# Patient Record
Sex: Female | Born: 1985 | Race: Black or African American | Hispanic: No | Marital: Married | Smoking: Never smoker
Health system: Southern US, Community
[De-identification: ages and names within clinical notes are randomized; demographics above are authoritative.]

## PROBLEM LIST (undated history)

## (undated) DIAGNOSIS — K759 Inflammatory liver disease, unspecified: Secondary | ICD-10-CM

## (undated) DIAGNOSIS — E079 Disorder of thyroid, unspecified: Secondary | ICD-10-CM

## (undated) HISTORY — DX: Inflammatory liver disease, unspecified: K75.9

## (undated) HISTORY — DX: Disorder of thyroid, unspecified: E07.9

---
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
## 2012-08-02 NOTE — L&D Delivery Note (Signed)
Delivery Note Pt arrived C/C/+2 with an urge to push.  AROM with light meconium.  At 8:18 AM a viable female was delivered via Vaginal, Spontaneous Delivery (Presentation: ROA  ).Loose nuchal cord, reduced.  APGAR: 8, 9; weight pending.   Placenta status: Intact, Spontaneous.  Cord: 3 vessels with the following complications: None.   Anesthesia: Local  Episiotomy: None Lacerations: 1st degree;Periurethral Suture Repair: 3.0 vicryl Est. Blood Loss (mL): 200  Mom to postpartum.  Baby to nursery-stable.  CRESENZO-DISHMAN,Gilbert Narain 04/27/2013, 9:14 AM

## 2012-08-02 NOTE — L&D Delivery Note (Signed)
Attestation of Attending Supervision of Advanced Practitioner (CNM/NP): Evaluation and management procedures were performed by the Advanced Practitioner under my supervision and collaboration.  I have reviewed the Advanced Practitioner's note and chart, and I agree with the management and plan.  HARRAWAY-SMITH, Cathe Bilger 11:51 AM

## 2012-10-25 ENCOUNTER — Encounter: Payer: Self-pay | Admitting: *Deleted

## 2012-11-09 ENCOUNTER — Encounter: Payer: Self-pay | Admitting: *Deleted

## 2012-11-09 ENCOUNTER — Other Ambulatory Visit: Payer: Self-pay | Admitting: Obstetrics & Gynecology

## 2012-11-09 ENCOUNTER — Other Ambulatory Visit (HOSPITAL_COMMUNITY)
Admission: RE | Admit: 2012-11-09 | Discharge: 2012-11-09 | Disposition: A | Discharge status: Home / Self Care | Payer: Medicaid Other | Source: Ambulatory Visit | Attending: Obstetrics & Gynecology | Admitting: Obstetrics & Gynecology

## 2012-11-09 ENCOUNTER — Ambulatory Visit (INDEPENDENT_AMBULATORY_CARE_PROVIDER_SITE_OTHER): Payer: Medicaid Other | Admitting: Obstetrics & Gynecology

## 2012-11-09 ENCOUNTER — Encounter: Payer: Self-pay | Admitting: Obstetrics & Gynecology

## 2012-11-09 VITALS — BP 105/70 | Temp 97.7°F | Wt 101.7 lb

## 2012-11-09 DIAGNOSIS — O0992 Supervision of high risk pregnancy, unspecified, second trimester: Secondary | ICD-10-CM | POA: Insufficient documentation

## 2012-11-09 DIAGNOSIS — Z01419 Encounter for gynecological examination (general) (routine) without abnormal findings: Secondary | ICD-10-CM | POA: Insufficient documentation

## 2012-11-09 DIAGNOSIS — O0991 Supervision of high risk pregnancy, unspecified, first trimester: Secondary | ICD-10-CM

## 2012-11-09 DIAGNOSIS — O09299 Supervision of pregnancy with other poor reproductive or obstetric history, unspecified trimester: Secondary | ICD-10-CM | POA: Insufficient documentation

## 2012-11-09 DIAGNOSIS — O09292 Supervision of pregnancy with other poor reproductive or obstetric history, second trimester: Secondary | ICD-10-CM

## 2012-11-09 DIAGNOSIS — Z113 Encounter for screening for infections with a predominantly sexual mode of transmission: Secondary | ICD-10-CM | POA: Insufficient documentation

## 2012-11-09 DIAGNOSIS — O98519 Other viral diseases complicating pregnancy, unspecified trimester: Secondary | ICD-10-CM

## 2012-11-09 NOTE — Progress Notes (Signed)
#  [id number] interpreter  Subjective:    Kelly Luna is a G2P1001 105w2d being seen today for her first obstetrical visit.  Her obstetrical history is significant for IUGR (5 pounds 7 ounces). Pregnancy history fully reviewed.  Pt has hepatitis B per medical record but pt denies today.  Labs pending.    Patient reports no complaints.  Filed Vitals:   11/09/12 0827  BP: 105/70  Temp: 97.7 F (36.5 C)  Weight: 101 lb 11.2 oz (46.131 kg)    HISTORY: OB History   Grav Para Term Preterm Abortions TAB SAB Ect Mult Living   2 1 1       1      # Outc Date GA Lbr Len/2nd Wgt Sex Del Anes PTL Lv   1 TRM 12/11 [redacted]w[redacted]d 01:30 5lb7oz(2.466kg) F SVD None  Yes   2 CUR              Past Medical History  Diagnosis Date  . Thyroid disease 2001    taking no medication  . Hepatitis     Hep B   History reviewed. No pertinent past surgical history. History reviewed. No pertinent family history.   Exam    Uterus:     Pelvic Exam:    Perineum: About 16 weeks   Vulva: normal   Vagina:  normal mucosa   pH: n/a   Cervix: closed, evidence of old cervical tear at 7 o'clock   Adnexa: normal adnexa   Bony Pelvis: average  System: Breast:  normal appearance, no masses or tenderness   Skin: normal coloration and turgor, no rashes    Neurologic: normal mood   Extremities: gait was normal for age, joint mobility appears intact   HEENT sclera clear, anicteric and oropharynx clear, no lesions   Mouth/Teeth mucous membranes moist, pharynx normal without lesions   Neck supple and no masses   Cardiovascular: regular rate and rhythm   Respiratory:  chest clear, no wheezing, crepitations, rhonchi, normal symmetric air entry   Abdomen: soft, non-tender; bowel sounds normal; no masses,  no organomegaly   Urinary: urethral meatus normal      Assessment:    Pregnancy: G2P1001 Patient Active Problem List  Diagnosis  . IUGR (intrauterine growth restriction) in prior pregnancy, pregnant         Plan:     Initial labs drawn. Prenatal vitamins. Problem list reviewed and updated. Genetic Screening discussed Quad Screen: ordered.  Ultrasound discussed; fetal survey: ordered.  Follow up in 4 weeks.    Evolette Pendell H. 11/09/2012

## 2012-11-09 NOTE — Progress Notes (Signed)
Ultrasound scheduled for 12/07/12 at 1:00 pm for anatomy.  [employer] # [phone] interpreted information.

## 2012-11-10 LAB — OBSTETRIC PANEL
Basophils Relative: 0 % (ref 0–1)
Eosinophils Absolute: 0.1 10*3/uL (ref 0.0–0.7)
Hemoglobin: 12 g/dL (ref 12.0–15.0)
Hepatitis B Surface Ag: POSITIVE — AB
Lymphs Abs: 1.1 10*3/uL (ref 0.7–4.0)
MCH: 28.9 pg (ref 26.0–34.0)
MCHC: 33.1 g/dL (ref 30.0–36.0)
Monocytes Relative: 10 % (ref 3–12)
Neutro Abs: 4.2 10*3/uL (ref 1.7–7.7)
Neutrophils Relative %: 70 % (ref 43–77)
Platelets: 233 10*3/uL (ref 150–400)
RBC: 4.15 MIL/uL (ref 3.87–5.11)

## 2012-11-11 ENCOUNTER — Other Ambulatory Visit: Payer: Self-pay | Admitting: Obstetrics & Gynecology

## 2012-11-11 ENCOUNTER — Encounter: Payer: Self-pay | Admitting: Obstetrics & Gynecology

## 2012-11-11 DIAGNOSIS — O98419 Viral hepatitis complicating pregnancy, unspecified trimester: Secondary | ICD-10-CM

## 2012-11-11 DIAGNOSIS — B1910 Unspecified viral hepatitis B without hepatic coma: Secondary | ICD-10-CM | POA: Insufficient documentation

## 2012-11-13 ENCOUNTER — Telehealth: Payer: Self-pay

## 2012-11-13 LAB — HEMOGLOBINOPATHY EVALUATION
Hemoglobin Other: 0 %
Hgb A2 Quant: 2.6 % (ref 2.2–3.2)
Hgb A: 97.4 % (ref 96.8–97.8)
Hgb S Quant: 0 %

## 2012-11-13 NOTE — Telephone Encounter (Signed)
Message copied by Faythe Casa on Mon Nov 13, 2012  4:09 PM ------      Message from: Lesly Dukes      Created: Sat Nov 11, 2012  9:20 AM       Hepatitis B positive during pregnancy: I will check Hep B Sag, (hep panel), Hepatitis B DNA, hep B e Ag, anti Hep B E ag, PT/INR CMP       ------

## 2012-11-13 NOTE — Telephone Encounter (Signed)
Called pt with [facility] [employer] # [phone] and was unable to leave message due to voicemail box not set up yet.

## 2012-11-16 NOTE — Telephone Encounter (Signed)
Added Note for patient to have labs drawn at next visit.

## 2012-11-23 ENCOUNTER — Other Ambulatory Visit: Payer: Self-pay | Admitting: Family Medicine

## 2012-11-23 ENCOUNTER — Ambulatory Visit (INDEPENDENT_AMBULATORY_CARE_PROVIDER_SITE_OTHER): Payer: Medicaid Other | Admitting: Family Medicine

## 2012-11-23 VITALS — BP 103/67 | Temp 97.9°F | Wt 103.5 lb

## 2012-11-23 DIAGNOSIS — O0992 Supervision of high risk pregnancy, unspecified, second trimester: Secondary | ICD-10-CM

## 2012-11-23 DIAGNOSIS — O98519 Other viral diseases complicating pregnancy, unspecified trimester: Secondary | ICD-10-CM

## 2012-11-23 DIAGNOSIS — O09292 Supervision of pregnancy with other poor reproductive or obstetric history, second trimester: Secondary | ICD-10-CM

## 2012-11-23 DIAGNOSIS — O09299 Supervision of pregnancy with other poor reproductive or obstetric history, unspecified trimester: Secondary | ICD-10-CM

## 2012-11-23 LAB — COMPREHENSIVE METABOLIC PANEL
BUN: 5 mg/dL — ABNORMAL LOW (ref 6–23)
CO2: 27 mEq/L (ref 19–32)
Calcium: 8.9 mg/dL (ref 8.4–10.5)
Chloride: 103 mEq/L (ref 96–112)
Creat: 0.45 mg/dL — ABNORMAL LOW (ref 0.50–1.10)
Total Bilirubin: 0.3 mg/dL (ref 0.3–1.2)

## 2012-11-23 LAB — PROTIME-INR
INR: 1.03 (ref <1.50)
Prothrombin Time: 13.5 seconds (ref 11.6–15.2)

## 2012-11-23 LAB — POCT URINALYSIS DIP (DEVICE)
Leukocytes, UA: NEGATIVE
Nitrite: NEGATIVE
Protein, ur: 30 mg/dL — AB
Urobilinogen, UA: 0.2 mg/dL (ref 0.0–1.0)

## 2012-11-23 NOTE — Progress Notes (Signed)
Reviewed labs with pt. Needs quad from HD. Hep B labs today Anatomy scheduled.

## 2012-11-23 NOTE — Progress Notes (Signed)
Pulse 70 

## 2012-11-23 NOTE — Patient Instructions (Addendum)
-       ( 3-6  )        .        9    1 1/2 .       18  20   .     .    . A   (  )    .       (  ) .           .             .              .       ( cholasma    )           .      Marland Kitchen                     .                .          .       (  ) .   (    )      .          .        24  28   .        .       .                .         .          Marland Kitchen                 .              .            Marland Kitchen                Marland Kitchen                       .              .       .              (  ) .                  .                 .                    .                 .     Marland Kitchen               .               .           .         .      .                .            (   ) .               .             .         .           .         (  )   .  15    3  4                .           .            .      4-5    .               .           .     .                   .                .  (     )         .                 .         .                 .                   (    ) .       .           .   ɡ     .                .               .            .            .      .                  .         .             .                 .      .                  .          .         .                    .        .                 .        (   )     ɡ               .         .                  .           2 .     :    .       .        .             .        .             .              .        .        .                 .            .                        .        .     .     .        .         .           .        .              .         10       3     .               .           .               .                  .    Marland Kitchen                   .             .      1    .       .                    .                     .                      .   .            .         .      .           .          .         .   .           .         .    :       .                   .   MEDICAL CARE IF :         102   ( 38.9   )       .       (   ) .                     .            .         .          .               .        (  )      .         .    2        2      1          .         .         .         .     (  ) .       ( )         .         .         .             .            .            .       .  : 07/13/2001  : 2011/10/11    : 01/15/2009 [employer]     [employer] .                  .                 .       (  )       .            .              (  SIDS ) .                    .           .             Marland Kitchen               Marland Kitchen                 .                 .        .    A                      3 .     .         .            .              .      3 4    .                       Marland Kitchen  [employer] AT THE              .    4      .           .        .                       Marland Kitchen                .                 .      2 3           .    "  . "                .                .                     .       . HOW TO TELL           .                   .           :     .       .       8 12     24   .       unlatches      (10 20    )      .   5 6   ( 6 8    )   24   5    6  .        3 4   24    6   .      .     4 7        4    .     .              .                 .      :  2 3 .                 .        10 20    .     .        .           Marland Kitchen            5 10      .          .       .              Marland Kitchen                Marland Kitchen               Marland Kitchen                  .                .          .          Marland Kitchen                     Marland Kitchen             Rolm Gala .          .                  .             24 48 .                .        .      .                   .       2 5   .                  .  "  "   48  24 .              5 10 .                    .             3 4     .      .        .          .                 .           .         ( 8  ).     .         .    :        .              .               .     6      5   .                  .   .  : 07/19/2005  : 2012/01/18    : 2011/10/17

## 2012-11-24 LAB — HEPATITIS B DNA, ULTRAQUANTITATIVE, PCR
Hepatitis B DNA (Calc): <116 copies/mL (ref <116)
Hepatitis B DNA: <20 IU/mL (ref <20)

## 2012-11-27 ENCOUNTER — Encounter: Payer: Self-pay | Admitting: Obstetrics & Gynecology

## 2012-11-27 ENCOUNTER — Encounter: Payer: Self-pay | Admitting: Family Medicine

## 2012-12-07 ENCOUNTER — Ambulatory Visit (HOSPITAL_COMMUNITY)
Admission: RE | Admit: 2012-12-07 | Discharge: 2012-12-07 | Disposition: A | Discharge status: Home / Self Care | Payer: Medicaid Other | Source: Ambulatory Visit | Attending: Obstetrics & Gynecology | Admitting: Obstetrics & Gynecology

## 2012-12-07 DIAGNOSIS — O0991 Supervision of high risk pregnancy, unspecified, first trimester: Secondary | ICD-10-CM

## 2012-12-07 DIAGNOSIS — Z3689 Encounter for other specified antenatal screening: Secondary | ICD-10-CM | POA: Insufficient documentation

## 2012-12-08 ENCOUNTER — Encounter: Payer: Self-pay | Admitting: Obstetrics & Gynecology

## 2012-12-21 ENCOUNTER — Ambulatory Visit (INDEPENDENT_AMBULATORY_CARE_PROVIDER_SITE_OTHER): Payer: Medicaid Other | Admitting: Family Medicine

## 2012-12-21 VITALS — BP 112/73 | Temp 97.2°F | Ht 63.0 in | Wt 109.7 lb

## 2012-12-21 DIAGNOSIS — O98519 Other viral diseases complicating pregnancy, unspecified trimester: Secondary | ICD-10-CM

## 2012-12-21 DIAGNOSIS — O0992 Supervision of high risk pregnancy, unspecified, second trimester: Secondary | ICD-10-CM

## 2012-12-21 LAB — POCT URINALYSIS DIP (DEVICE)
Glucose, UA: NEGATIVE mg/dL
Hgb urine dipstick: NEGATIVE
Protein, ur: NEGATIVE mg/dL
Specific Gravity, Urine: 1.02 (ref 1.005–1.030)
Urobilinogen, UA: 0.2 mg/dL (ref 0.0–1.0)

## 2012-12-21 MED ORDER — PRENATAL VITAMINS PLUS 27-1 MG PO TABS: 1.0000 | ORAL_TABLET | Freq: Every day | ORAL | Status: DC

## 2012-12-21 NOTE — Progress Notes (Signed)
No concerns. 

## 2012-12-21 NOTE — Patient Instructions (Signed)
Hepatitis B in Pregnancy Hepatitis B is a DNA virus that can be found in the blood, semen, vaginal fluids and saliva. HBV can cause liver damage (cirrhosis), liver cancer and even death. It is spread by:  Touching an object that has the virus on it (the virus can live for 7 days on surfaces).  Infected blood.  Body fluids.  Sexual intercourse.  Infected needles.  Childbirth. People infected with HBV can spread the disease to others even if they are not sick.  All pregnant women should be tested for HBV even if they do not have symptoms. It is safe to give the HBV vaccine to a pregnant woman. They can pass the virus on to the baby at delivery. Women infected with HBV can also cause medical problems for herself. Pregnant women infected with HBV in the first trimester have a higher risk of losing their baby (miscarriage).  Long-term (chronic) infection of the HBV is seen more often in babies and children. Infected babies have a high chance of being carriers of HBV and can pass the virus on to others. These babies also are at risk of dying from cirrhosis of the liver when they are adults. CAUSES  People infected with HBV can infect others by contact with or through:  Poor hygiene.  Infected blood.  Infected needles.  Body fluids.  Sexual intercourse.  Childbirth. SYMPTOMS   Weakness.  Tiredness.  Feeling sick to your stomach (nausea).  Loss of hunger (appetite).  Pain in the liver area (upper abdomen and stomach area).  Muscles pains.  Dark urine (brownish).  Wallace Cullens or very light color stool.  Eyes and skin become yellow (jaundice).  Symptoms of severe hepatitis may cause blood clotting problems and damage to brain cells (encephalopathy). DIAGNOSIS  HBV is diagnosed when a blood test for the virus is positive. If the test is positive, your caregiver will do more blood tests to see if your liver is infected with the virus. Family members and people you have been in  contact with should also be tested for the HBV.  TREATMENT AND PREVENTION  All pregnant women who are HBV negative should get the HBV vaccine.  Anyone exposed to the HBV should get a Hepatitis B Immune Globulin (HBIG) and the HBV vaccine.  Pregnant women who have HBV should get the HBIG vaccine in the third trimester. This will protect the baby at birth.  Pregnant women who have HBV should have their newborn get the HBG vaccine within 12 hours of birth and the first dose of the HBV vaccine.  Pregnant women who do not have HBV should have their newborn get the first dose of the HBV vaccine before leaving the hospital. This will prevent the baby from getting HBV.  All newborns, children and teenagers who have not received the HBV vaccine should get it.  Wash your hands with hot water and soap after a using the toilet, changing the baby and before cooking and feeding the baby. The HBV vaccine is made from only a part of the hepatitis B virus. Therefore, it does not cause an infection. It is usually given in 3 or 4 separate injections. Soreness at the site of injection and a mild fever (99.9 F [37.7 C] or a little bit higher) may happen. Serious problems are very rare. PEOPLE AT RISK FOR HBV WHO SHOULD RECEIVE THE HBV VACCINATION  Health care workers, especially those exposed to blood.  People using and injecting illegal drugs.  People having unprotected  sexual intercourse or have many partners.  Sex partners with one of the partners with the HBV.  Nursing home workers (or home for the disabled).  People with chronic liver or kidney disease (dialysis patients).  People who travel to foreign countries where there are high rates of HBV.  Prisoners.  Homosexuals. PEOPLE WHO SHOULD NOT RECEIVE THE HBV VACCINE:  Anyone allergic to baker's yeast or other ingredients in the vaccine.  If you had an allergic reaction to a past HBV vaccine dose.  If you have any illness at the time of  the vaccine injection. HOME CARE INSTRUCTIONS   If you have never been tested for HBV or received the HBV vaccine, you should do it as soon as possible.  Tell your caregiver if you have any allergies before getting the HBV vaccine.  Do not have unprotected sexual intercourse.  Do not take and inject illegal drugs.  Only take over-the-counter pain medication for pain, discomfort or fever as directed by your caregiver. SEEK IMMEDIATE MEDICAL CARE IF:   You think you have been exposed to the HBV.  You think you are having an allergic reaction to the vaccine.  You develop a fever of 102 F (38.9 C) or higher.  You skin and eyes start looking yellow (jaundiced).  You have dark brown urine.  You have gray or very light colored stool.  You have upper abdominal pain (liver and stomach area).  You feel sick to your stomach (nausea), weak and do not feel like eating. Any severe allergic reaction to the vaccine should be reported by your caregiver or by you to the Vaccine Adverse Event Reporting System (VAERS) by calling [phone] or on the web site, www.vaers.LAg[facility].no. Document Released: 01/05/2008 Document Revised: 04/12/2012 Document Reviewed: 01/05/2008 [facility] Patient Information 2014 [place], [place].  Pregnancy - Second Trimester The second trimester of pregnancy (3 to 6 months) is a period of rapid growth for you and your baby. At the end of the sixth month, your baby is about 9 inches long and weighs 1 1/2 pounds. You will begin to feel the baby move between 18 and 20 weeks of the pregnancy. This is called quickening. Weight gain is faster. A clear fluid (colostrum) may leak out of your breasts. You may feel small contractions of the womb (uterus). This is known as false labor or Braxton-Hicks contractions. This is like a practice for labor when the baby is ready to be born. Usually, the problems with morning sickness have usually passed by the end of your first trimester. Some  women develop small dark blotches (called cholasma, mask of pregnancy) on their face that usually goes away after the baby is born. Exposure to the sun makes the blotches worse. Acne may also develop in some pregnant women and pregnant women who have acne, may find that it goes away. PRENATAL EXAMS  Blood work may continue to be done during prenatal exams. These tests are done to check on your health and the probable health of your baby. Blood work is used to follow your blood levels (hemoglobin). Anemia (low hemoglobin) is common during pregnancy. Iron and vitamins are given to help prevent this. You will also be checked for diabetes between 24 and 28 weeks of the pregnancy. Some of the previous blood tests may be repeated.  The size of the uterus is measured during each visit. This is to make sure that the baby is continuing to grow properly according to the dates of the pregnancy.  Your  blood pressure is checked every prenatal visit. This is to make sure you are not getting toxemia.  Your urine is checked to make sure you do not have an infection, diabetes or protein in the urine.  Your weight is checked often to make sure gains are happening at the suggested rate. This is to ensure that both you and your baby are growing normally.  Sometimes, an ultrasound is performed to confirm the proper growth and development of the baby. This is a test which bounces harmless sound waves off the baby so your caregiver can more accurately determine due dates. Sometimes, a test is done on the amniotic fluid surrounding the baby. This test is called an amniocentesis. The amniotic fluid is obtained by sticking a needle into the belly (abdomen). This is done to check the chromosomes in instances where there is a concern about possible genetic problems with the baby. It is also sometimes done near the end of pregnancy if an early delivery is required. In this case, it is done to help make sure the baby's lungs are  mature enough for the baby to live outside of the womb. CHANGES OCCURING IN THE SECOND TRIMESTER OF PREGNANCY Your body goes through many changes during pregnancy. They vary from person to person. Talk to your caregiver about changes you notice that you are concerned about.  During the second trimester, you will likely have an increase in your appetite. It is normal to have cravings for certain foods. This varies from person to person and pregnancy to pregnancy.  Your lower abdomen will begin to bulge.  You may have to urinate more often because the uterus and baby are pressing on your bladder. It is also common to get more bladder infections during pregnancy. You can help this by drinking lots of fluids and emptying your bladder before and after intercourse.  You may begin to get stretch marks on your hips, abdomen, and breasts. These are normal changes in the body during pregnancy. There are no exercises or medicines to take that prevent this change.  You may begin to develop swollen and bulging veins (varicose veins) in your legs. Wearing support hose, elevating your feet for 15 minutes, 3 to 4 times a day and limiting salt in your diet helps lessen the problem.  Heartburn may develop as the uterus grows and pushes up against the stomach. Antacids recommended by your caregiver helps with this problem. Also, eating smaller meals 4 to 5 times a day helps.  Constipation can be treated with a stool softener or adding bulk to your diet. Drinking lots of fluids, and eating vegetables, fruits, and whole grains are helpful.  Exercising is also helpful. If you have been very active up until your pregnancy, most of these activities can be continued during your pregnancy. If you have been less active, it is helpful to start an exercise program such as walking.  Hemorrhoids may develop at the end of the second trimester. Warm sitz baths and hemorrhoid cream recommended by your caregiver helps hemorrhoid  problems.  Backaches may develop during this time of your pregnancy. Avoid heavy lifting, wear low heal shoes, and practice good posture to help with backache problems.  Some pregnant women develop tingling and numbness of their hand and fingers because of swelling and tightening of ligaments in the wrist (carpel tunnel syndrome). This goes away after the baby is born.  As your breasts enlarge, you may have to get a bigger bra. Get a comfortable, cotton,  support bra. Do not get a nursing bra until the last month of the pregnancy if you will be nursing the baby.  You may get a dark line from your belly button to the pubic area called the linea nigra.  You may develop rosy cheeks because of increase blood flow to the face.  You may develop spider looking lines of the face, neck, arms, and chest. These go away after the baby is born. HOME CARE INSTRUCTIONS   It is extremely important to avoid all smoking, herbs, alcohol, and unprescribed drugs during your pregnancy. These chemicals affect the formation and growth of the baby. Avoid these chemicals throughout the pregnancy to ensure the delivery of a healthy infant.  Most of your home care instructions are the same as suggested for the first trimester of your pregnancy. Keep your caregiver's appointments. Follow your caregiver's instructions regarding medicine use, exercise, and diet.  During pregnancy, you are providing food for you and your baby. Continue to eat regular, well-balanced meals. Choose foods such as meat, fish, milk and other low fat dairy products, vegetables, fruits, and whole-grain breads and cereals. Your caregiver will tell you of the ideal weight gain.  A physical sexual relationship may be continued up until near the end of pregnancy if there are no other problems. Problems could include early (premature) leaking of amniotic fluid from the membranes, vaginal bleeding, abdominal pain, or other medical or pregnancy  problems.  Exercise regularly if there are no restrictions. Check with your caregiver if you are unsure of the safety of some of your exercises. The greatest weight gain will occur in the last 2 trimesters of pregnancy. Exercise will help you:  Control your weight.  Get you in shape for labor and delivery.  Lose weight after you have the baby.  Wear a good support or jogging bra for breast tenderness during pregnancy. This may help if worn during sleep. Pads or tissues may be used in the bra if you are leaking colostrum.  Do not use hot tubs, steam rooms or saunas throughout the pregnancy.  Wear your seat belt at all times when driving. This protects you and your baby if you are in an accident.  Avoid raw meat, uncooked cheese, cat litter boxes, and soil used by cats. These carry germs that can cause birth defects in the baby.  The second trimester is also a good time to visit your dentist for your dental health if this has not been done yet. Getting your teeth cleaned is okay. Use a soft toothbrush. Brush gently during pregnancy.  It is easier to leak urine during pregnancy. Tightening up and strengthening the pelvic muscles will help with this problem. Practice stopping your urination while you are going to the bathroom. These are the same muscles you need to strengthen. It is also the muscles you would use as if you were trying to stop from passing gas. You can practice tightening these muscles up 10 times a set and repeating this about 3 times per day. Once you know what muscles to tighten up, do not perform these exercises during urination. It is more likely to contribute to an infection by backing up the urine.  Ask for help if you have financial, counseling, or nutritional needs during pregnancy. Your caregiver will be able to offer counseling for these needs as well as refer you for other special needs.  Your skin may become oily. If so, wash your face with mild soap, use non-greasy  moisturizer  and oil or cream based makeup. MEDICINES AND DRUG USE IN PREGNANCY  Take prenatal vitamins as directed. The vitamin should contain 1 milligram of folic acid. Keep all vitamins out of reach of children. Only a couple vitamins or tablets containing iron may be fatal to a baby or young child when ingested.  Avoid use of all medicines, including herbs, over-the-counter medicines, not prescribed or suggested by your caregiver. Only take over-the-counter or prescription medicines for pain, discomfort, or fever as directed by your caregiver. Do not use aspirin.  Let your caregiver also know about herbs you may be using.  Alcohol is related to a number of birth defects. This includes fetal alcohol syndrome. All alcohol, in any form, should be avoided completely. Smoking will cause low birth rate and premature babies.  Street or illegal drugs are very harmful to the baby. They are absolutely forbidden. A baby born to an addicted mother will be addicted at birth. The baby will go through the same withdrawal an adult does. SEEK MEDICAL CARE IF:  You have any concerns or worries during your pregnancy. It is better to call with your questions if you feel they cannot wait, rather than worry about them. SEEK IMMEDIATE MEDICAL CARE IF:   An unexplained oral temperature above 102 F (38.9 C) develops, or as your caregiver suggests.  You have leaking of fluid from the vagina (birth canal). If leaking membranes are suspected, take your temperature and tell your caregiver of this when you call.  There is vaginal spotting, bleeding, or passing clots. Tell your caregiver of the amount and how many pads are used. Light spotting in pregnancy is common, especially following intercourse.  You develop a bad smelling vaginal discharge with a change in the color from clear to white.  You continue to feel sick to your stomach (nauseated) and have no relief from remedies suggested. You vomit blood or coffee  ground-like materials.  You lose more than 2 pounds of weight or gain more than 2 pounds of weight over 1 week, or as suggested by your caregiver.  You notice swelling of your face, hands, feet, or legs.  You get exposed to [place] measles and have never had them.  You are exposed to fifth disease or chickenpox.  You develop belly (abdominal) pain. Round ligament discomfort is a common non-cancerous (benign) cause of abdominal pain in pregnancy. Your caregiver still must evaluate you.  You develop a bad headache that does not go away.  You develop fever, diarrhea, pain with urination, or shortness of breath.  You develop visual problems, blurry, or double vision.  You fall or are in a car accident or any kind of trauma.  There is mental or physical violence at home. Document Released: 07/13/2001 Document Revised: 04/12/2012 Document Reviewed: 01/15/2009 [facility] Patient Information 2014 [place], [place].

## 2013-01-18 ENCOUNTER — Ambulatory Visit (INDEPENDENT_AMBULATORY_CARE_PROVIDER_SITE_OTHER): Payer: Medicaid Other | Admitting: Family

## 2013-01-18 DIAGNOSIS — B1910 Unspecified viral hepatitis B without hepatic coma: Secondary | ICD-10-CM

## 2013-01-18 DIAGNOSIS — O0992 Supervision of high risk pregnancy, unspecified, second trimester: Secondary | ICD-10-CM

## 2013-01-18 DIAGNOSIS — O09299 Supervision of pregnancy with other poor reproductive or obstetric history, unspecified trimester: Secondary | ICD-10-CM

## 2013-01-18 DIAGNOSIS — O98519 Other viral diseases complicating pregnancy, unspecified trimester: Secondary | ICD-10-CM

## 2013-01-18 LAB — POCT URINALYSIS DIP (DEVICE)
Bilirubin Urine: NEGATIVE
Hgb urine dipstick: NEGATIVE
Nitrite: NEGATIVE
Specific Gravity, Urine: 1.02 (ref 1.005–1.030)
pH: 7 (ref 5.0–8.0)

## 2013-01-18 NOTE — Progress Notes (Signed)
Pulse- 78  Pain-on right side (baby lies there)

## 2013-01-18 NOTE — Progress Notes (Signed)
MFM apointment scheduled 01/26/13 at 1 pm.

## 2013-01-18 NOTE — Progress Notes (Signed)
Pt reports pain on right side; no bleeding or leaking; baby often positioned on that side; palpated no mass on that side; reviewed warning precautions; discussed bilateral choroid plexus cysts finding with interpreter in room, schedule MFM consult with ultrasound. Urine results not available upon discharge.

## 2013-01-23 ENCOUNTER — Other Ambulatory Visit: Payer: Self-pay | Admitting: Obstetrics & Gynecology

## 2013-01-23 DIAGNOSIS — O350XX0 Maternal care for (suspected) central nervous system malformation in fetus, not applicable or unspecified: Secondary | ICD-10-CM

## 2013-01-26 ENCOUNTER — Ambulatory Visit (HOSPITAL_COMMUNITY)
Admission: RE | Admit: 2013-01-26 | Discharge: 2013-01-26 | Disposition: A | Discharge status: Home / Self Care | Payer: Medicaid Other | Source: Ambulatory Visit | Attending: Family | Admitting: Family

## 2013-01-26 DIAGNOSIS — Z1389 Encounter for screening for other disorder: Secondary | ICD-10-CM | POA: Insufficient documentation

## 2013-01-26 DIAGNOSIS — O350XX0 Maternal care for (suspected) central nervous system malformation in fetus, not applicable or unspecified: Secondary | ICD-10-CM | POA: Insufficient documentation

## 2013-01-26 DIAGNOSIS — O3500X0 Maternal care for (suspected) central nervous system malformation or damage in fetus, unspecified, not applicable or unspecified: Secondary | ICD-10-CM | POA: Insufficient documentation

## 2013-01-26 DIAGNOSIS — Z363 Encounter for antenatal screening for malformations: Secondary | ICD-10-CM | POA: Insufficient documentation

## 2013-01-26 DIAGNOSIS — O358XX0 Maternal care for other (suspected) fetal abnormality and damage, not applicable or unspecified: Secondary | ICD-10-CM | POA: Insufficient documentation

## 2013-01-26 NOTE — Progress Notes (Signed)
Kelly Luna  was seen today for an ultrasound appointment.  See full report in [facility].  Impression: Single IUP at 26 3/7 weeks Normal quad screen Bilateral choroid plexus cysts are again noted, although smaller than previously reported  The remainder of the fetal anatomy is within normal limits. Fetal growth is appropriate (61st %tile) Normal amniotic fluid.  In the absense of other ultrasound findings and in light of normal fetal growth, the risk of Trisomy 18 is essentially negligible.  Recommendations: Follow-up ultrasounds as clinically indicated.   Alpha Gula, MD

## 2013-01-29 ENCOUNTER — Encounter: Payer: Self-pay | Admitting: Obstetrics & Gynecology

## 2013-02-01 ENCOUNTER — Ambulatory Visit (INDEPENDENT_AMBULATORY_CARE_PROVIDER_SITE_OTHER): Payer: Medicaid Other | Admitting: Advanced Practice Midwife

## 2013-02-01 ENCOUNTER — Encounter: Payer: Self-pay | Admitting: Family

## 2013-02-01 VITALS — BP 96/64 | Temp 98.5°F | Wt 114.8 lb

## 2013-02-01 DIAGNOSIS — O09899 Supervision of other high risk pregnancies, unspecified trimester: Secondary | ICD-10-CM

## 2013-02-01 DIAGNOSIS — O09892 Supervision of other high risk pregnancies, second trimester: Secondary | ICD-10-CM

## 2013-02-01 LAB — POCT URINALYSIS DIP (DEVICE)
Ketones, ur: NEGATIVE mg/dL
Nitrite: NEGATIVE
Urobilinogen, UA: 0.2 mg/dL (ref 0.0–1.0)

## 2013-02-01 LAB — CBC
Platelets: 214 10*3/uL (ref 150–400)
RBC: 3.78 MIL/uL — ABNORMAL LOW (ref 3.87–5.11)
RDW: 15 % (ref 11.5–15.5)
WBC: 8.5 10*3/uL (ref 4.0–10.5)

## 2013-02-01 NOTE — Progress Notes (Signed)
This encounter was created in error - please disregard.

## 2013-02-01 NOTE — Progress Notes (Signed)
Patient here to see Ann Clark, RN, Diabetes CNS for DM education only.  

## 2013-02-01 NOTE — Progress Notes (Signed)
Pulse: 69

## 2013-02-01 NOTE — Addendum Note (Signed)
Addended by: Jaynie Collins A on: 02/01/2013 10:29 AM   Modules accepted: Level of Service, SmartSet

## 2013-02-01 NOTE — Patient Instructions (Signed)
Return to clinic for any obstetric concerns or go to [facility] for evaluation  

## 2013-02-01 NOTE — Progress Notes (Signed)
No PIH Sx.  Concerned about [place] results.

## 2013-02-02 LAB — GLUCOSE TOLERANCE, 1 HOUR (50G) W/O FASTING: Glucose, 1 Hour GTT: 128 mg/dL (ref 70–140)

## 2013-02-02 LAB — HIV ANTIBODY (ROUTINE TESTING W REFLEX): HIV: NONREACTIVE

## 2013-02-02 NOTE — Progress Notes (Signed)
CPC smaller. Follow up as clinically indicated.  28 week testing.

## 2013-02-02 NOTE — Patient Instructions (Signed)
Guidelines for Antenatal Testing and Sonography  (Revised 09/2012)  INDICATION U/S NST/BPP DELIVERY  Diabetes   A1 - good control - 648.83    A2 - good control    Poor control or poor compliance    (Macrosomia or polyhydramnios)    B-C and A2/B - 648.03    D-R-F-T or poor control B-C  20-38  20-38  20-26-30-34-38   20-24-28-32-35-38//fetal echo  20-24-27-30-33-36-38//fetal echo  40  32//2 x wk  32//2 x wk   32//2 x wk  28//BPP wkly then 32//2 x wk  40  39  FLM   39  FLM  CHTN - 642.03   Group I   BP < 140/90, no PIH, AGA,  nml AFV, +/- meds     Group II   BP > 140/90, on meds, no PIH, AGA, nml AFV  20-28-34-38  20-24-28-31-34-37  32//2 x wk  28//BPP wkly then 32//2 x wk  40 no meds; 39 meds  FLM or 39  Pre-eclampsia   Mild - 642.43    Severe - 642.53  Q 2 wks   Q 2 wks  28//BPP wkly then 32//2 x wk  Inpatient  37   PRN  IUGR- 656.53   EFW < 10% w/ AEDF & low AFV or EFW < 3%     EFW < 10%, Nml Dopplers & AFV, no other comorbidities  PRN  28-30-32-34-36-38    Dx//2 x wk then 24//BPP wkly  PRN  39 or PRN  Graves Disease - 648.13 28-32-36 32//2 x wk 39  Multiple Gestation - 651.03       MC/DA    Concordant (< 20%) nml AFV, AGA   Discordant (>20%)               DC/DA   Concordant (<20%), nml AFV, AGA, no other comorbidities      Discordant (> 20%)     Q 2 wks 16-32, q 3wks 32-delivery Q 1 wk, Fluid alternating w/ growth   20-24-28-32-36  Q 2-3 wks   32//2 x wk 24//BPP wkly then 32//2 x wk   35//2 x wk  28//2 x wk   37-38 PRN   38  FLM or 38   Advanced maternal Age > 40 y.o. - 659.63 20-24-28-32-36 36//2 x wk 40  Previous Stillbirth (> 28 wks) - V23.5 20-24-28-32-36 28//BPP wkly then 32//2 x wk 39  Oligohydramnios - 658.03   AGA, nml anatomy      AFV 6-10       AFV < 5   Q 2-3 wks  Q 2 wks   28//BPP wkly then 32//2 x wk  28//BPP wkly then 32//2 x wk   39  FLM or 37  Cholestasis - 576.8  At dx, then q 2-3 wks 28//BPP wkly then 32//2 x wk FLM or 37  Polyhydramnios - 657.03   Nml anatomy Q 3 wks 28//BPP wkly then 32//2 x wk 39  Renal Disease - 646.23   Cr > 1.2, proteinuria 20-24-28-32-36 28//BPP wkly then 32//2 x wk 39  SLE (lupus) - 710.0 20-24-28-32-36 32//2 x wk 39  Sickle Cell Disease 20-24-28-32-36 32//2 x wk 39  HIV - 042, 647.63 20-26-32-36 PRN 39  Decreased Fetal Movement- 655.73  2 x wk PRN; BPP wkly if < 32 wks   **2 x wk testing = NST 2 x wk + AFI (modified BPP) wkly** **True BPP needed in pts. With multiple comorbidities**   

## 2013-02-06 ENCOUNTER — Encounter: Payer: Self-pay | Admitting: Advanced Practice Midwife

## 2013-02-06 DIAGNOSIS — O3503X0 Maternal care for (suspected) central nervous system malformation or damage in fetus, choroid plexus cysts, not applicable or unspecified: Secondary | ICD-10-CM | POA: Insufficient documentation

## 2013-02-06 DIAGNOSIS — O350XX0 Maternal care for (suspected) central nervous system malformation in fetus, not applicable or unspecified: Secondary | ICD-10-CM | POA: Insufficient documentation

## 2013-02-15 ENCOUNTER — Ambulatory Visit (INDEPENDENT_AMBULATORY_CARE_PROVIDER_SITE_OTHER): Payer: Medicaid Other | Admitting: Obstetrics & Gynecology

## 2013-02-15 VITALS — BP 108/68 | Temp 98.7°F | Wt 117.0 lb

## 2013-02-15 DIAGNOSIS — O09299 Supervision of pregnancy with other poor reproductive or obstetric history, unspecified trimester: Secondary | ICD-10-CM

## 2013-02-15 DIAGNOSIS — O09293 Supervision of pregnancy with other poor reproductive or obstetric history, third trimester: Secondary | ICD-10-CM

## 2013-02-15 LAB — POCT URINALYSIS DIP (DEVICE)
Bilirubin Urine: NEGATIVE
Glucose, UA: 100 mg/dL — AB
Nitrite: NEGATIVE
Urobilinogen, UA: 0.2 mg/dL (ref 0.0–1.0)

## 2013-02-15 MED ORDER — PRENATAL VITAMINS PLUS 27-1 MG PO TABS: 1.0000 | ORAL_TABLET | Freq: Every day | ORAL | Status: AC

## 2013-02-15 NOTE — Progress Notes (Signed)
Growth 6/27 is 61st percentil.  Will get f/u US in 1 week.

## 2013-02-15 NOTE — Progress Notes (Signed)
Pulse: 87 Pt needs refill on her PNV.

## 2013-02-15 NOTE — Progress Notes (Signed)
Ultrasound scheduled with MFM for 02/22/13 at 3:15 pm.

## 2013-02-15 NOTE — Addendum Note (Signed)
Addended by: Lesly Dukes on: 02/15/2013 11:48 AM   Modules accepted: Orders

## 2013-02-22 ENCOUNTER — Ambulatory Visit (HOSPITAL_COMMUNITY)
Admission: RE | Admit: 2013-02-22 | Discharge: 2013-02-22 | Disposition: A | Discharge status: Home / Self Care | Payer: Medicaid Other | Source: Ambulatory Visit | Attending: Obstetrics & Gynecology | Admitting: Obstetrics & Gynecology

## 2013-02-22 ENCOUNTER — Encounter (HOSPITAL_COMMUNITY): Payer: Self-pay

## 2013-02-22 VITALS — BP 117/69 | HR 75 | Wt 119.0 lb

## 2013-02-22 DIAGNOSIS — O09293 Supervision of pregnancy with other poor reproductive or obstetric history, third trimester: Secondary | ICD-10-CM

## 2013-02-22 DIAGNOSIS — O3500X0 Maternal care for (suspected) central nervous system malformation or damage in fetus, unspecified, not applicable or unspecified: Secondary | ICD-10-CM | POA: Insufficient documentation

## 2013-02-22 DIAGNOSIS — O09299 Supervision of pregnancy with other poor reproductive or obstetric history, unspecified trimester: Secondary | ICD-10-CM | POA: Insufficient documentation

## 2013-02-22 DIAGNOSIS — O350XX0 Maternal care for (suspected) central nervous system malformation in fetus, not applicable or unspecified: Secondary | ICD-10-CM | POA: Insufficient documentation

## 2013-02-22 NOTE — Progress Notes (Signed)
Kelly Luna  was seen today for an ultrasound appointment.  See full report in [facility].  Impression: Single IUP at 30 2/7 weeks HepB surface antigen positive, hx of prior SGA infant Normal interval anatomy Fetal growth is appropriate (55th %tile) Normal amniotic fluid.  Recommendations: Recommend follow-up ultrasound examination in 4 weeks for interval growth.  Alpha Gula, MD

## 2013-03-01 ENCOUNTER — Ambulatory Visit (INDEPENDENT_AMBULATORY_CARE_PROVIDER_SITE_OTHER): Payer: Medicaid Other | Admitting: Advanced Practice Midwife

## 2013-03-01 VITALS — BP 110/71 | Temp 97.7°F | Wt 118.0 lb

## 2013-03-01 DIAGNOSIS — O350XX0 Maternal care for (suspected) central nervous system malformation in fetus, not applicable or unspecified: Secondary | ICD-10-CM

## 2013-03-01 DIAGNOSIS — Z23 Encounter for immunization: Secondary | ICD-10-CM

## 2013-03-01 LAB — POCT URINALYSIS DIP (DEVICE)
Bilirubin Urine: NEGATIVE
Glucose, UA: NEGATIVE mg/dL
Nitrite: NEGATIVE
pH: 6.5 (ref 5.0–8.0)

## 2013-03-01 MED ORDER — TETANUS-DIPHTH-ACELL PERTUSSIS 5-2.5-18.5 LF-MCG/0.5 IM SUSP
0.5000 mL | Freq: Once | INTRAMUSCULAR | Status: AC
  Administered 2013-03-01: 0.5 mL via INTRAMUSCULAR

## 2013-03-01 NOTE — Addendum Note (Signed)
Addended by: Faythe Casa on: 03/01/2013 12:55 PM   Modules accepted: Orders

## 2013-03-01 NOTE — Progress Notes (Signed)
Doing well.  Good fetal movement, denies vaginal bleeding, LOF, regular contractions. TDAP given today.

## 2013-03-01 NOTE — Progress Notes (Signed)
P=86, Used Equities trader

## 2013-03-01 NOTE — Addendum Note (Signed)
Addended by: Faythe Casa on: 03/01/2013 12:44 PM   Modules accepted: Orders

## 2013-03-05 ENCOUNTER — Encounter: Payer: Self-pay | Admitting: Obstetrics & Gynecology

## 2013-03-12 ENCOUNTER — Encounter: Payer: Self-pay | Admitting: *Deleted

## 2013-03-15 ENCOUNTER — Ambulatory Visit (INDEPENDENT_AMBULATORY_CARE_PROVIDER_SITE_OTHER): Payer: Medicaid Other | Admitting: Obstetrics & Gynecology

## 2013-03-15 DIAGNOSIS — O98519 Other viral diseases complicating pregnancy, unspecified trimester: Secondary | ICD-10-CM

## 2013-03-15 LAB — POCT URINALYSIS DIP (DEVICE)
Bilirubin Urine: NEGATIVE
Glucose, UA: NEGATIVE mg/dL
Ketones, ur: NEGATIVE mg/dL
Protein, ur: 30 mg/dL — AB

## 2013-03-15 NOTE — Patient Instructions (Signed)

## 2013-03-15 NOTE — Progress Notes (Signed)
Pulse- 86   Pain-right side

## 2013-03-15 NOTE — Progress Notes (Signed)
[place] scheduled next week. Growth has been nl.

## 2013-03-17 LAB — CULTURE, OB URINE: Colony Count: >=100000

## 2013-03-22 ENCOUNTER — Other Ambulatory Visit (HOSPITAL_COMMUNITY): Payer: Self-pay | Admitting: Maternal and Fetal Medicine

## 2013-03-22 ENCOUNTER — Ambulatory Visit (HOSPITAL_COMMUNITY)
Admission: RE | Admit: 2013-03-22 | Discharge: 2013-03-22 | Disposition: A | Discharge status: Home / Self Care | Payer: Medicaid Other | Source: Ambulatory Visit | Attending: Family Medicine | Admitting: Family Medicine

## 2013-03-22 VITALS — BP 111/70 | HR 76 | Wt 121.0 lb

## 2013-03-22 DIAGNOSIS — O09299 Supervision of pregnancy with other poor reproductive or obstetric history, unspecified trimester: Secondary | ICD-10-CM | POA: Insufficient documentation

## 2013-03-22 DIAGNOSIS — O3503X1 Maternal care for (suspected) central nervous system malformation or damage in fetus, choroid plexus cysts, fetus 1: Secondary | ICD-10-CM

## 2013-03-22 DIAGNOSIS — O350XX0 Maternal care for (suspected) central nervous system malformation in fetus, not applicable or unspecified: Secondary | ICD-10-CM | POA: Insufficient documentation

## 2013-03-22 DIAGNOSIS — O09293 Supervision of pregnancy with other poor reproductive or obstetric history, third trimester: Secondary | ICD-10-CM

## 2013-03-22 DIAGNOSIS — O3500X0 Maternal care for (suspected) central nervous system malformation or damage in fetus, unspecified, not applicable or unspecified: Secondary | ICD-10-CM | POA: Insufficient documentation

## 2013-03-22 DIAGNOSIS — O350XX1 Maternal care for (suspected) central nervous system malformation in fetus, fetus 1: Secondary | ICD-10-CM

## 2013-03-22 NOTE — Progress Notes (Signed)
[facility] ultrasound  Indication: 27 yr old G2P1001 at [redacted]w[redacted]d with likely chronic hepatitis B, history of growth restricted neonate, and previous finding of choroid plexus cysts for fetal growth.  Findings: 1. Single intrauterine pregnancy. 2. Estimated fetal weight is in the 40th%. The abdominal circumference is in the 8th%. 3. Anterior placenta without evidence of previa. 4. Normal amniotic fluid index. 5. The limited anatomy survey is normal. 6. Normal umbilical artery Doppler studies.  Recommendations: 1. Overall appropriate fetal growth - lagging abdominal circumference- normal Doppler studies - recommend fetal growth in 3 weeks especially given previous growth restricted neonate  2. Recommend fetal kick counts 3. Chronic hepatitis B: - recommend avoid invasive monitoring if possible - recommend inform Pediatrics at delivery for hepB immunoglobulin and vaccine for the neonate 4. Previous finding of choroid plexus cysts: - previously counseled - had normal quad screen  Kelly Foster, MD

## 2013-03-29 ENCOUNTER — Encounter: Payer: Self-pay | Admitting: Family Medicine

## 2013-03-29 ENCOUNTER — Ambulatory Visit (INDEPENDENT_AMBULATORY_CARE_PROVIDER_SITE_OTHER): Payer: Medicaid Other | Admitting: Family Medicine

## 2013-03-29 VITALS — BP 104/65 | Temp 97.4°F | Wt 121.7 lb

## 2013-03-29 DIAGNOSIS — O350XX0 Maternal care for (suspected) central nervous system malformation in fetus, not applicable or unspecified: Secondary | ICD-10-CM

## 2013-03-29 DIAGNOSIS — O0992 Supervision of high risk pregnancy, unspecified, second trimester: Secondary | ICD-10-CM

## 2013-03-29 DIAGNOSIS — O98519 Other viral diseases complicating pregnancy, unspecified trimester: Secondary | ICD-10-CM

## 2013-03-29 DIAGNOSIS — O0930 Supervision of pregnancy with insufficient antenatal care, unspecified trimester: Secondary | ICD-10-CM

## 2013-03-29 DIAGNOSIS — O350XX1 Maternal care for (suspected) central nervous system malformation in fetus, fetus 1: Secondary | ICD-10-CM

## 2013-03-29 LAB — POCT URINALYSIS DIP (DEVICE)
Glucose, UA: NEGATIVE mg/dL
Hgb urine dipstick: NEGATIVE
Nitrite: NEGATIVE
Urobilinogen, UA: 0.2 mg/dL (ref 0.0–1.0)

## 2013-03-29 NOTE — Progress Notes (Signed)
S<D but MFM U/s last week shows appropriate growth--next is scheduled Cultures next week.

## 2013-03-29 NOTE — Progress Notes (Signed)
P= 75,  Used Interpreter.

## 2013-03-29 NOTE — Patient Instructions (Signed)
Pregnancy - Third Trimester The third trimester of pregnancy (the last 3 months) is a period of the most rapid growth for you and your baby. The baby approaches a length of 20 inches and a weight of 6 to 10 pounds. The baby is adding on fat and getting ready for life outside your body. While inside, babies have periods of sleeping and waking, sucking thumbs, and hiccuping. You can often feel small contractions of the uterus. This is false labor. It is also called Braxton-Hicks contractions. This is like a practice for labor. The usual problems in this stage of pregnancy include more difficulty breathing, swelling of the hands and feet from water retention, and having to urinate more often because of the uterus and baby pressing on your bladder.  PRENATAL EXAMS  Blood work may continue to be done during prenatal exams. These tests are done to check on your health and the probable health of your baby. Blood work is used to follow your blood levels (hemoglobin). Anemia (low hemoglobin) is common during pregnancy. Iron and vitamins are given to help prevent this. You may also continue to be checked for diabetes. Some of the past blood tests may be done again.  The size of the uterus is measured during each visit. This makes sure your baby is growing properly according to your pregnancy dates.  Your blood pressure is checked every prenatal visit. This is to make sure you are not getting toxemia.  Your urine is checked every prenatal visit for infection, diabetes, and protein.  Your weight is checked at each visit. This is done to make sure gains are happening at the suggested rate and that you and your baby are growing normally.  Sometimes, an ultrasound is performed to confirm the position and the proper growth and development of the baby. This is a test done that bounces harmless sound waves off the baby so your caregiver can more accurately determine a due date.  Discuss the type of pain medicine and  anesthesia you will have during your labor and delivery.  Discuss the possibility and anesthesia if a cesarean section might be necessary.  Inform your caregiver if there is any mental or physical violence at home. Sometimes, a specialized non-stress test, contraction stress test, and biophysical profile are done to make sure the baby is not having a problem. Checking the amniotic fluid surrounding the baby is called an amniocentesis. The amniotic fluid is removed by sticking a needle into the belly (abdomen). This is sometimes done near the end of pregnancy if an early delivery is required. In this case, it is done to help make sure the baby's lungs are mature enough for the baby to live outside of the womb. If the lungs are not mature and it is unsafe to deliver the baby, an injection of cortisone medicine is given to the mother 1 to 2 days before the delivery. This helps the baby's lungs mature and makes it safer to deliver the baby. CHANGES OCCURING IN THE THIRD TRIMESTER OF PREGNANCY Your body goes through many changes during pregnancy. They vary from person to person. Talk to your caregiver about changes you notice and are concerned about.  During the last trimester, you have probably had an increase in your appetite. It is normal to have cravings for certain foods. This varies from person to person and pregnancy to pregnancy.  You may begin to get stretch marks on your hips, abdomen, and breasts. These are normal changes in the body   during pregnancy. There are no exercises or medicines to take which prevent this change.  Constipation may be treated with a stool softener or adding bulk to your diet. Drinking lots of fluids, fiber in vegetables, fruits, and whole grains are helpful.  Exercising is also helpful. If you have been very active up until your pregnancy, most of these activities can be continued during your pregnancy. If you have been less active, it is helpful to start an exercise  program such as walking. Consult your caregiver before starting exercise programs.  Avoid all smoking, alcohol, non-prescribed drugs, herbs and "street drugs" during your pregnancy. These chemicals affect the formation and growth of the baby. Avoid chemicals throughout the pregnancy to ensure the delivery of a healthy infant.  Backache, varicose veins, and hemorrhoids may develop or get worse.  You will tire more easily in the third trimester, which is normal.  The baby's movements may be stronger and more often.  You may become short of breath easily.  Your belly button may stick out.  A yellow discharge may leak from your breasts called colostrum.  You may have a bloody mucus discharge. This usually occurs a few days to a week before labor begins. HOME CARE INSTRUCTIONS   Keep your caregiver's appointments. Follow your caregiver's instructions regarding medicine use, exercise, and diet.  During pregnancy, you are providing food for you and your baby. Continue to eat regular, well-balanced meals. Choose foods such as meat, fish, milk and other low fat dairy products, vegetables, fruits, and whole-grain breads and cereals. Your caregiver will tell you of the ideal weight gain.  A physical sexual relationship may be continued throughout pregnancy if there are no other problems such as early (premature) leaking of amniotic fluid from the membranes, vaginal bleeding, or belly (abdominal) pain.  Exercise regularly if there are no restrictions. Check with your caregiver if you are unsure of the safety of your exercises. Greater weight gain will occur in the last 2 trimesters of pregnancy. Exercising helps:  Control your weight.  Get you in shape for labor and delivery.  You lose weight after you deliver.  Rest a lot with legs elevated, or as needed for leg cramps or low back pain.  Wear a good support or jogging bra for breast tenderness during pregnancy. This may help if worn during  sleep. Pads or tissues may be used in the bra if you are leaking colostrum.  Do not use hot tubs, steam rooms, or saunas.  Wear your seat belt when driving. This protects you and your baby if you are in an accident.  Avoid raw meat, cat litter boxes and soil used by cats. These carry germs that can cause birth defects in the baby.  It is easier to leak urine during pregnancy. Tightening up and strengthening the pelvic muscles will help with this problem. You can practice stopping your urination while you are going to the bathroom. These are the same muscles you need to strengthen. It is also the muscles you would use if you were trying to stop from passing gas. You can practice tightening these muscles up 10 times a set and repeating this about 3 times per day. Once you know what muscles to tighten up, do not perform these exercises during urination. It is more likely to cause an infection by backing up the urine.  Ask for help if you have financial, counseling, or nutritional needs during pregnancy. Your caregiver will be able to offer counseling for these   needs as well as refer you for other special needs.  Make a list of emergency phone numbers and have them available.  Plan on getting help from family or friends when you go home from the hospital.  Make a trial run to the hospital.  Take prenatal classes with the father to understand, practice, and ask questions about the labor and delivery.  Prepare the baby's room or nursery.  Do not travel out of the city unless it is absolutely necessary and with the advice of your caregiver.  Wear only low or no heal shoes to have better balance and prevent falling. MEDICINES AND DRUG USE IN PREGNANCY  Take prenatal vitamins as directed. The vitamin should contain 1 milligram of folic acid. Keep all vitamins out of reach of children. Only a couple vitamins or tablets containing iron may be fatal to a baby or young child when ingested.  Avoid use  of all medicines, including herbs, over-the-counter medicines, not prescribed or suggested by your caregiver. Only take over-the-counter or prescription medicines for pain, discomfort, or fever as directed by your caregiver. Do not use aspirin, ibuprofen or naproxen unless approved by your caregiver.  Let your caregiver also know about herbs you may be using.  Alcohol is related to a number of birth defects. This includes fetal alcohol syndrome. All alcohol, in any form, should be avoided completely. Smoking will cause low birth rate and premature babies.  Illegal drugs are very harmful to the baby. They are absolutely forbidden. A baby born to an addicted mother will be addicted at birth. The baby will go through the same withdrawal an adult does. SEEK MEDICAL CARE IF: You have any concerns or worries during your pregnancy. It is better to call with your questions if you feel they cannot wait, rather than worry about them. SEEK IMMEDIATE MEDICAL CARE IF:   An unexplained oral temperature above 102 F (38.9 C) develops, or as your caregiver suggests.  You have leaking of fluid from the vagina. If leaking membranes are suspected, take your temperature and tell your caregiver of this when you call.  There is vaginal spotting, bleeding or passing clots. Tell your caregiver of the amount and how many pads are used.  You develop a bad smelling vaginal discharge with a change in the color from clear to white.  You develop vomiting that lasts more than 24 hours.  You develop chills or fever.  You develop shortness of breath.  You develop burning on urination.  You loose more than 2 pounds of weight or gain more than 2 pounds of weight or as suggested by your caregiver.  You notice sudden swelling of your face, hands, and feet or legs.  You develop belly (abdominal) pain. Round ligament discomfort is a common non-cancerous (benign) cause of abdominal pain in pregnancy. Your caregiver still  must evaluate you.  You develop a severe headache that does not go away.  You develop visual problems, blurred or double vision.  If you have not felt your baby move for more than 1 hour. If you think the baby is not moving as much as usual, eat something with sugar in it and lie down on your left side for an hour. The baby should move at least 4 to 5 times per hour. Call right away if your baby moves less than that.  You fall, are in a car accident, or any kind of trauma.  There is mental or physical violence at home. Document Released: 07/13/2001   Document Revised: 04/12/2012 Document Reviewed: 01/15/2009 [employer] Patient Information 2014 [employer].  Breastfeeding A change in hormones during your pregnancy causes growth of your breast tissue and an increase in number and size of milk ducts. The hormone prolactin allows proteins, sugars, and fats from your blood supply to make breast milk in your milk-producing glands. The hormone progesterone prevents breast milk from being released before the birth of your baby. After the birth of your baby, your progesterone level decreases allowing breast milk to be released. Thoughts of your baby, as well as his or her sucking or crying, can stimulate the release of milk from the milk-producing glands. Deciding to breastfeed (nurse) is one of the best choices you can make for you and your baby. The information that follows gives a brief review of the benefits, as well as other important skills to know about breastfeeding. BENEFITS OF BREASTFEEDING For your baby  The first milk (colostrum) helps your baby's digestive system function better.   There are antibodies in your milk that help your baby fight off infections.   Your baby has a lower incidence of asthma, allergies, and sudden infant death syndrome (SIDS).   The nutrients in breast milk are better for your baby than infant formulas.  Breast milk improves your baby's brain development.    Your baby will have less gas, colic, and constipation.  Your baby is less likely to develop other conditions, such as childhood obesity, asthma, or diabetes mellitus. For you  Breastfeeding helps develop a very special bond between you and your baby.   Breastfeeding is convenient, always available at the correct temperature, and costs nothing.   Breastfeeding helps to burn calories and helps you lose the weight gained during pregnancy.   Breastfeeding makes your uterus contract back down to normal size faster and slows bleeding following delivery.   Breastfeeding mothers have a lower risk of developing osteoporosis or breast or ovarian cancer later in life.  BREASTFEEDING FREQUENCY  A healthy, full-term baby may breastfeed as often as every hour or space his or her feedings to every 3 hours. Breastfeeding frequency will vary from baby to baby.   Newborns should be fed no less than every 2 3 hours during the day and every 4 5 hours during the night. You should breastfeed a minimum of 8 feedings in a 24 hour period.  Awaken your baby to breastfeed if it has been 3 4 hours since the last feeding.  Breastfeed when you feel the need to reduce the fullness of your breasts or when your newborn shows signs of hunger. Signs that your baby may be hungry include:  Increased alertness or activity.  Stretching.  Movement of the head from side to side.  Movement of the head and opening of the mouth when the corner of the mouth or cheek is stroked (rooting).  Increased sucking sounds, smacking lips, cooing, sighing, or squeaking.  Hand-to-mouth movements.  Increased sucking of fingers or hands.  Fussing.  Intermittent crying.  Signs of extreme hunger will require calming and consoling before you try to feed your baby. Signs of extreme hunger may include:  Restlessness.  A loud, strong cry.  Screaming.  Frequent feeding will help you make more milk and will help prevent  problems, such as sore nipples and engorgement of the breasts.  BREASTFEEDING   Whether lying down or sitting, be sure that the baby's abdomen is facing your abdomen.   Support your breast with 4 fingers under your breast   and your thumb above your nipple. Make sure your fingers are well away from your nipple and your baby's mouth.   Stroke your baby's lips gently with your finger or nipple.   When your baby's mouth is open wide enough, place all of your nipple and as much of the colored area around your nipple (areola) as possible into your baby's mouth.  More areola should be visible above his or her upper lip than below his or her lower lip.  Your baby's tongue should be between his or her lower gum and your breast.  Ensure that your baby's mouth is correctly positioned around the nipple (latched). Your baby's lips should create a seal on your breast.  Signs that your baby has effectively latched onto your nipple include:  Tugging or sucking without pain.  Swallowing heard between sucks.  Absent click or smacking sound.  Muscle movement above and in front of his or her ears with sucking.  Your baby must suck about 2 3 minutes in order to get your milk. Allow your baby to feed on each breast as long as he or she wants. Nurse your baby until he or she unlatches or falls asleep at the first breast, then offer the second breast.  Signs that your baby is full and satisfied include:  A gradual decrease in the number of sucks or complete cessation of sucking.  Falling asleep.  Extension or relaxation of his or her body.  Retention of a small amount of milk in his or her mouth.  Letting go of your breast by himself or herself.  Signs of effective breastfeeding in you include:  Breasts that have increased firmness, weight, and size prior to feeding.  Breasts that are softer after nursing.  Increased milk volume, as well as a change in milk consistency and color by the 5th  day of breastfeeding.  Breast fullness relieved by breastfeeding.  Nipples are not sore, cracked, or bleeding.  If needed, break the suction by putting your finger into the corner of your baby's mouth and sliding your finger between his or her gums. Then, remove your breast from his or her mouth.  It is common for babies to spit up a small amount after a feeding.  Babies often swallow air during feeding. This can make babies fussy. Burping your baby between breasts can help with this.  Vitamin D supplements are recommended for babies who get only breast milk.  Avoid using a pacifier during your baby's first 4 6 weeks.  Avoid supplemental feedings of water, formula, or juice in place of breastfeeding. Breast milk is all the food your baby needs. It is not necessary for your baby to have water or formula. Your breasts will make more milk if supplemental feedings are avoided during the early weeks. HOW TO TELL WHETHER YOUR BABY IS GETTING ENOUGH BREAST MILK Wondering whether or not your baby is getting enough milk is a common concern among mothers. You can be assured that your baby is getting enough milk if:   Your baby is actively sucking and you hear swallowing.   Your baby seems relaxed and satisfied after a feeding.   Your baby nurses at least 8 12 times in a 24 hour time period.  During the first 3 5 days of age:  Your baby is wetting at least 3 5 diapers in a 24 hour period. The urine should be clear and pale yellow.  Your baby is having at least 3 4 stools in   a 24 hour period. The stool should be soft and yellow.  At 5 7 days of age, your baby is having at least 3 6 stools in a 24 hour period. The stool should be seedy and yellow by 5 days of age.  Your baby has a weight loss less than 7 10% during the first 3 days of age.  Your baby does not lose weight after 3 7 days of age.  Your baby gains 4 7 ounces each week after he or she is 4 days of age.  Your baby gains weight  by 5 days of age and is back to birth weight within 2 weeks. ENGORGEMENT In the first week after your baby is born, you may experience extremely full breasts (engorgement). When engorged, your breasts may feel heavy, warm, or tender to the touch. Engorgement peaks within 24 48 hours after delivery of your baby.  Engorgement may be reduced by:  Continuing to breastfeed.  Increasing the frequency of breastfeeding.  Taking warm showers or applying warm, moist heat to your breasts just before each feeding. This increases circulation and helps the milk flow.   Gently massaging your breast before and during the feedings. With your fingertips, massage from your chest wall towards your nipple in a circular motion.   Ensuring that your baby empties at least one breast at every feeding. It also helps to start the next feeding on the opposite breast.   Expressing breast milk by hand or by using a breast pump to empty the breasts if your baby is sleepy, or not nursing well. You may also want to express milk if you are returning to work oryou feel you are getting engorged.  Ensuring your baby is latched on and positioned properly while breastfeeding. If you follow these suggestions, your engorgement should improve in 24 48 hours. If you are still experiencing difficulty, call your lactation consultant or caregiver.  CARING FOR YOURSELF Take care of your breasts.  Bathe or shower daily.   Avoid using soap on your nipples.   Wear a supportive bra. Avoid wearing underwire style bras.  Air dry your nipples for a 3 4minutes after each feeding.   Use only cotton bra pads to absorb breast milk leakage. Leaking of breast milk between feedings is normal.   Use only pure lanolin on your nipples after nursing. You do not need to wash it off before feeding your baby again. Another option is to express a few drops of breast milk and gently massage that milk into your nipples.  Continue breast  self-awareness checks. Take care of yourself.  Eat healthy foods. Alternate 3 meals with 3 snacks.  Avoid foods that you notice affect your baby in a bad way.  Drink milk, fruit juice, and water to satisfy your thirst (about 8 glasses a day).   Rest often, relax, and take your prenatal vitamins to prevent fatigue, stress, and anemia.  Avoid chewing and smoking tobacco.  Avoid alcohol and drug use.  Take over-the-counter and prescribed medicine only as directed by your caregiver or pharmacist. You should always check with your caregiver or pharmacist before taking any new medicine, vitamin, or herbal supplement.  Know that pregnancy is possible while breastfeeding. If desired, talk to your caregiver about family planning and safe birth control methods that may be used while breastfeeding. SEEK MEDICAL CARE IF:   You feel like you want to stop breastfeeding or have become frustrated with breastfeeding.  You have painful breasts or nipples.    Your nipples are cracked or bleeding.  Your breasts are red, tender, or warm.  You have a swollen area on either breast.  You have a fever or chills.  You have nausea or vomiting.  You have drainage from your nipples.  Your breasts do not become full before feedings by the 5th day after delivery.  You feel sad and depressed.  Your baby is too sleepy to eat well.  Your baby is having trouble sleeping.   Your baby is wetting less than 3 diapers in a 24 hour period.  Your baby has less than 3 stools in a 24 hour period.  Your baby's skin or the white part of his or her eyes becomes more yellow.   Your baby is not gaining weight by 5 days of age. MAKE SURE YOU:   Understand these instructions.  Will watch your condition.  Will get help right away if you are not doing well or get worse. Document Released: 07/19/2005 Document Revised: 04/12/2012 Document Reviewed: 02/23/2012 [employer] Patient Information 2014 [employer].  

## 2013-04-05 ENCOUNTER — Encounter: Payer: Self-pay | Admitting: Obstetrics and Gynecology

## 2013-04-05 ENCOUNTER — Ambulatory Visit (INDEPENDENT_AMBULATORY_CARE_PROVIDER_SITE_OTHER): Payer: Medicaid Other | Admitting: Obstetrics and Gynecology

## 2013-04-05 VITALS — BP 110/76 | Temp 97.5°F | Wt 122.9 lb

## 2013-04-05 DIAGNOSIS — O09293 Supervision of pregnancy with other poor reproductive or obstetric history, third trimester: Secondary | ICD-10-CM

## 2013-04-05 DIAGNOSIS — O0992 Supervision of high risk pregnancy, unspecified, second trimester: Secondary | ICD-10-CM

## 2013-04-05 DIAGNOSIS — O09299 Supervision of pregnancy with other poor reproductive or obstetric history, unspecified trimester: Secondary | ICD-10-CM

## 2013-04-05 DIAGNOSIS — O98519 Other viral diseases complicating pregnancy, unspecified trimester: Secondary | ICD-10-CM

## 2013-04-05 DIAGNOSIS — O350XX0 Maternal care for (suspected) central nervous system malformation in fetus, not applicable or unspecified: Secondary | ICD-10-CM

## 2013-04-05 DIAGNOSIS — O350XX1 Maternal care for (suspected) central nervous system malformation in fetus, fetus 1: Secondary | ICD-10-CM

## 2013-04-05 LAB — POCT URINALYSIS DIP (DEVICE)
Bilirubin Urine: NEGATIVE
Nitrite: NEGATIVE
Protein, ur: NEGATIVE mg/dL
pH: 6.5 (ref 5.0–8.0)

## 2013-04-05 NOTE — Progress Notes (Signed)
Patient doing well without complaints. FM/labor precautions reviewed. Cultures collected 

## 2013-04-05 NOTE — Progress Notes (Signed)
Pulse: 70 Used Public affairs consultant [phone]

## 2013-04-06 LAB — GC/CHLAMYDIA PROBE AMP: GC Probe RNA: NEGATIVE

## 2013-04-09 ENCOUNTER — Encounter: Payer: Self-pay | Admitting: Obstetrics and Gynecology

## 2013-04-12 ENCOUNTER — Ambulatory Visit (INDEPENDENT_AMBULATORY_CARE_PROVIDER_SITE_OTHER): Payer: Medicaid Other | Admitting: Obstetrics & Gynecology

## 2013-04-12 ENCOUNTER — Ambulatory Visit (HOSPITAL_COMMUNITY)
Admission: RE | Admit: 2013-04-12 | Discharge: 2013-04-12 | Disposition: A | Discharge status: Home / Self Care | Payer: Medicaid Other | Source: Ambulatory Visit | Attending: Family Medicine | Admitting: Family Medicine

## 2013-04-12 ENCOUNTER — Other Ambulatory Visit (HOSPITAL_COMMUNITY): Payer: Self-pay | Admitting: Obstetrics and Gynecology

## 2013-04-12 VITALS — BP 119/78 | Temp 97.4°F | Wt 123.6 lb

## 2013-04-12 DIAGNOSIS — O09293 Supervision of pregnancy with other poor reproductive or obstetric history, third trimester: Secondary | ICD-10-CM

## 2013-04-12 DIAGNOSIS — O350XX1 Maternal care for (suspected) central nervous system malformation in fetus, fetus 1: Secondary | ICD-10-CM

## 2013-04-12 DIAGNOSIS — O09299 Supervision of pregnancy with other poor reproductive or obstetric history, unspecified trimester: Secondary | ICD-10-CM

## 2013-04-12 DIAGNOSIS — Z609 Problem related to social environment, unspecified: Secondary | ICD-10-CM

## 2013-04-12 DIAGNOSIS — O98519 Other viral diseases complicating pregnancy, unspecified trimester: Secondary | ICD-10-CM

## 2013-04-12 DIAGNOSIS — O350XX0 Maternal care for (suspected) central nervous system malformation in fetus, not applicable or unspecified: Secondary | ICD-10-CM | POA: Insufficient documentation

## 2013-04-12 DIAGNOSIS — Z603 Acculturation difficulty: Secondary | ICD-10-CM

## 2013-04-12 DIAGNOSIS — O3503X1 Maternal care for (suspected) central nervous system malformation or damage in fetus, choroid plexus cysts, fetus 1: Secondary | ICD-10-CM

## 2013-04-12 DIAGNOSIS — O3500X0 Maternal care for (suspected) central nervous system malformation or damage in fetus, unspecified, not applicable or unspecified: Secondary | ICD-10-CM | POA: Insufficient documentation

## 2013-04-12 DIAGNOSIS — Z789 Other specified health status: Secondary | ICD-10-CM

## 2013-04-12 DIAGNOSIS — Z758 Other problems related to medical facilities and other health care: Secondary | ICD-10-CM | POA: Insufficient documentation

## 2013-04-12 NOTE — Progress Notes (Signed)
Arabic interpreter present. GBS and other cultures negative. Follow up growth scan scheduled today.  No other complaints or concerns.  Fetal movement and labor precautions reviewed. Of note, patient was unable to give a urine sample today, will recheck at next visit.

## 2013-04-12 NOTE — Progress Notes (Signed)
Pulse: 72

## 2013-04-12 NOTE — Patient Instructions (Signed)
Return to clinic for any obstetric concerns or go to [facility] for evaluation  

## 2013-04-18 ENCOUNTER — Encounter: Payer: Self-pay | Admitting: *Deleted

## 2013-04-19 ENCOUNTER — Ambulatory Visit (INDEPENDENT_AMBULATORY_CARE_PROVIDER_SITE_OTHER): Payer: Medicaid Other | Admitting: Obstetrics & Gynecology

## 2013-04-19 ENCOUNTER — Other Ambulatory Visit (HOSPITAL_COMMUNITY): Payer: Self-pay | Admitting: Maternal and Fetal Medicine

## 2013-04-19 ENCOUNTER — Ambulatory Visit (HOSPITAL_COMMUNITY)
Admission: RE | Admit: 2013-04-19 | Discharge: 2013-04-19 | Disposition: A | Discharge status: Home / Self Care | Payer: Medicaid Other | Source: Ambulatory Visit | Attending: Maternal and Fetal Medicine | Admitting: Maternal and Fetal Medicine

## 2013-04-19 ENCOUNTER — Ambulatory Visit (HOSPITAL_COMMUNITY): Admission: RE | Admit: 2013-04-19 | Payer: Medicaid Other | Source: Ambulatory Visit

## 2013-04-19 DIAGNOSIS — O09299 Supervision of pregnancy with other poor reproductive or obstetric history, unspecified trimester: Secondary | ICD-10-CM | POA: Insufficient documentation

## 2013-04-19 DIAGNOSIS — O0992 Supervision of high risk pregnancy, unspecified, second trimester: Secondary | ICD-10-CM

## 2013-04-19 DIAGNOSIS — O98519 Other viral diseases complicating pregnancy, unspecified trimester: Secondary | ICD-10-CM

## 2013-04-19 DIAGNOSIS — O350XX0 Maternal care for (suspected) central nervous system malformation in fetus, not applicable or unspecified: Secondary | ICD-10-CM | POA: Insufficient documentation

## 2013-04-19 DIAGNOSIS — O365931 Maternal care for other known or suspected poor fetal growth, third trimester, fetus 1: Secondary | ICD-10-CM

## 2013-04-19 DIAGNOSIS — O3500X0 Maternal care for (suspected) central nervous system malformation or damage in fetus, unspecified, not applicable or unspecified: Secondary | ICD-10-CM | POA: Insufficient documentation

## 2013-04-19 LAB — POCT URINALYSIS DIP (DEVICE)
Protein, ur: 30 mg/dL — AB
Specific Gravity, Urine: >=1.03 (ref 1.005–1.030)
Urobilinogen, UA: 0.2 mg/dL (ref 0.0–1.0)

## 2013-04-19 NOTE — Progress Notes (Signed)
Pulse: 71

## 2013-04-19 NOTE — Patient Instructions (Signed)
Vaginal Delivery  Your caregiver must first be sure you are in labor. Signs of labor include:   You may pass what is called "the mucus plug" before labor begins. This is a small amount of blood stained mucus.   Regular uterine contractions.   The time between contractions get closer together.   The discomfort and pain gradually gets more intense.   Pains are mostly located in the back.   Pains get worse when walking.   The cervix (the opening of the uterus) becomes thinner (begins to efface) and opens up (dilates).  Once you are in labor and admitted into the hospital or care center, your caregiver will do the following:   A complete physical examination.   Check your vital signs (blood pressure, pulse, temperature and the fetal heart rate).   Do a vaginal examination (using a sterile glove and lubricant) to determine:   The position (presentation) of the baby (head [vertex] or buttock first).   The level (station) of the baby's head in the birth canal.   The effacement and dilatation of the cervix.   You may have your pubic hair shaved and be given an enema depending on your caregiver and the circumstance.   An electronic monitor is usually placed on your abdomen. The monitor follows the length and intensity of the contractions, as well as the baby's heart rate.   Usually, your caregiver will insert an IV in your arm with a bottle of sugar water. This is done as a precaution so that medications can be given to you quickly during labor or delivery.  NORMAL LABOR AND DELIVERY IS DIVIDED UP INTO 3 STAGES:  First Stage  This is when regular contractions begin and the cervix begins to efface and dilate. This stage can last from 3 to 15 hours. The end of the first stage is when the cervix is 100% effaced and 10 centimeters dilated. Pain medications may be given by    Injection (morphine, demerol, etc.)    Regional anesthesia (spinal, caudal or epidural, anesthetics given in different locations of the spine). Paracervical pain medication may be given, which is an injection of and anesthetic on each side of the cervix.  A pregnant woman may request to have "Natural Childbirth" which is not to have any medications or anesthesia during her labor and delivery.  Second Stage  This is when the baby comes down through the birth canal (vagina) and is born. This can take 1 to 4 hours. As the baby's head comes down through the birth canal, you may feel like you are going to have a bowel movement. You will get the urge to bear down and push until the baby is delivered. As the baby's head is being delivered, the caregiver will decide if an episiotomy (a cut in the perineum and vagina area) is needed to prevent tearing of the tissue in this area. The episiotomy is sewn up after the delivery of the baby and placenta. Sometimes a mask with nitrous oxide is given for the mother to breath during the delivery of the baby to help if there is too much pain. The end of Stage 2 is when the baby is fully delivered. Then when the umbilical cord stops pulsating it is clamped and cut.  Third Stage  The third stage begins after the baby is completely delivered and ends after the placenta (afterbirth) is delivered. This usually takes 5 to 30 minutes. After the placenta is delivered, a medication is given   either by intravenous or injection to help contract the uterus and prevent bleeding. The third stage is not painful and pain medication is usually not necessary. If an episiotomy was done, it is repaired at this time.  After the delivery, the mother is watched and monitored closely for 1 to 2 hours to make sure there is no postpartum bleeding (hemorrhage). If there is a lot of bleeding, medication is given to contract the uterus and stop the bleeding.  Document Released: 04/27/2008 Document Revised: 04/12/2012 Document Reviewed: 04/27/2008   [employer] Patient Information 2014 [employer].

## 2013-04-19 NOTE — Progress Notes (Signed)
BPP today. [place] last week 20%ile, continue to 40 weeks. Labor precautions given

## 2013-04-26 ENCOUNTER — Ambulatory Visit (HOSPITAL_COMMUNITY): Admission: RE | Admit: 2013-04-26 | Payer: Medicaid Other | Source: Ambulatory Visit

## 2013-04-26 ENCOUNTER — Ambulatory Visit (INDEPENDENT_AMBULATORY_CARE_PROVIDER_SITE_OTHER): Payer: Medicaid Other | Admitting: Obstetrics and Gynecology

## 2013-04-26 ENCOUNTER — Encounter: Payer: Self-pay | Admitting: Obstetrics and Gynecology

## 2013-04-26 ENCOUNTER — Ambulatory Visit (HOSPITAL_COMMUNITY)
Admission: RE | Admit: 2013-04-26 | Discharge: 2013-04-26 | Disposition: A | Discharge status: Home / Self Care | Payer: Medicaid Other | Source: Ambulatory Visit | Attending: Obstetrics and Gynecology | Admitting: Obstetrics and Gynecology

## 2013-04-26 ENCOUNTER — Other Ambulatory Visit: Payer: Self-pay | Admitting: Obstetrics and Gynecology

## 2013-04-26 DIAGNOSIS — O09293 Supervision of pregnancy with other poor reproductive or obstetric history, third trimester: Secondary | ICD-10-CM

## 2013-04-26 DIAGNOSIS — O09299 Supervision of pregnancy with other poor reproductive or obstetric history, unspecified trimester: Secondary | ICD-10-CM

## 2013-04-26 DIAGNOSIS — O3500X0 Maternal care for (suspected) central nervous system malformation or damage in fetus, unspecified, not applicable or unspecified: Secondary | ICD-10-CM | POA: Insufficient documentation

## 2013-04-26 DIAGNOSIS — O98519 Other viral diseases complicating pregnancy, unspecified trimester: Secondary | ICD-10-CM

## 2013-04-26 DIAGNOSIS — O0992 Supervision of high risk pregnancy, unspecified, second trimester: Secondary | ICD-10-CM

## 2013-04-26 DIAGNOSIS — O350XX0 Maternal care for (suspected) central nervous system malformation in fetus, not applicable or unspecified: Secondary | ICD-10-CM | POA: Insufficient documentation

## 2013-04-26 LAB — POCT URINALYSIS DIP (DEVICE)
Nitrite: NEGATIVE
Protein, ur: 100 mg/dL — AB
Urobilinogen, UA: 1 mg/dL (ref 0.0–1.0)
pH: 7 (ref 5.0–8.0)

## 2013-04-26 NOTE — Progress Notes (Signed)
Pulse: 68

## 2013-04-26 NOTE — Progress Notes (Signed)
Patient doing well without complaints. Will schedule BPP today and IOL at 40 weeks. FM/labor precautions reviewed

## 2013-04-26 NOTE — Progress Notes (Signed)
Patient sent to [facility] now for BPP and AFI with her Arabic interpreter. IOL scheduled 05/01/13 at 730 pm. Patient given appointment sheet.

## 2013-04-27 ENCOUNTER — Encounter (HOSPITAL_COMMUNITY): Payer: Self-pay | Admitting: *Deleted

## 2013-04-27 ENCOUNTER — Inpatient Hospital Stay (HOSPITAL_COMMUNITY)
Admission: AD | Admit: 2013-04-27 | Discharge: 2013-04-29 | DRG: 775 | Disposition: A | Discharge status: Home / Self Care | Payer: Medicaid Other | Source: Ambulatory Visit | Attending: Obstetrics & Gynecology | Admitting: Obstetrics & Gynecology

## 2013-04-27 DIAGNOSIS — O0992 Supervision of high risk pregnancy, unspecified, second trimester: Secondary | ICD-10-CM

## 2013-04-27 DIAGNOSIS — O09293 Supervision of pregnancy with other poor reproductive or obstetric history, third trimester: Secondary | ICD-10-CM

## 2013-04-27 DIAGNOSIS — O3503X1 Maternal care for (suspected) central nervous system malformation or damage in fetus, choroid plexus cysts, fetus 1: Secondary | ICD-10-CM

## 2013-04-27 DIAGNOSIS — O350XX1 Maternal care for (suspected) central nervous system malformation in fetus, fetus 1: Secondary | ICD-10-CM

## 2013-04-27 LAB — CBC
MCH: 25.2 pg — ABNORMAL LOW (ref 26.0–34.0)
MCV: 78.4 fL (ref 78.0–100.0)
Platelets: 186 10*3/uL (ref 150–400)
RDW: 16 % — ABNORMAL HIGH (ref 11.5–15.5)

## 2013-04-27 MED ORDER — FERROUS SULFATE 325 (65 FE) MG PO TABS
325.0000 mg | ORAL_TABLET | Freq: Two times a day (BID) | ORAL | Status: DC
  Administered 2013-04-27 – 2013-04-29 (×4): 325 mg via ORAL
  Filled 2013-04-27 (×4): qty 1

## 2013-04-27 MED ORDER — LIDOCAINE HCL (PF) 1 % IJ SOLN
INTRAMUSCULAR | Status: AC
  Administered 2013-04-27: 30 mL
  Filled 2013-04-27: qty 30

## 2013-04-27 MED ORDER — SENNOSIDES-DOCUSATE SODIUM 8.6-50 MG PO TABS
2.0000 | ORAL_TABLET | Freq: Every day | ORAL | Status: DC
  Administered 2013-04-28: 2 via ORAL

## 2013-04-27 MED ORDER — PRENATAL MULTIVITAMIN CH
1.0000 | ORAL_TABLET | Freq: Every day | ORAL | Status: DC
  Administered 2013-04-27 – 2013-04-28 (×2): 1 via ORAL
  Filled 2013-04-27 (×2): qty 1

## 2013-04-27 MED ORDER — DIBUCAINE 1 % RE OINT: 1.0000 "application " | TOPICAL_OINTMENT | RECTAL | Status: DC | PRN

## 2013-04-27 MED ORDER — METHYLERGONOVINE MALEATE 0.2 MG/ML IJ SOLN: 0.2000 mg | INTRAMUSCULAR | Status: DC | PRN

## 2013-04-27 MED ORDER — OXYTOCIN 40 UNITS IN LACTATED RINGERS INFUSION - SIMPLE MED: 62.5000 mL/h | INTRAVENOUS | Status: DC | PRN

## 2013-04-27 MED ORDER — ONDANSETRON HCL 4 MG PO TABS: 4.0000 mg | ORAL_TABLET | ORAL | Status: DC | PRN

## 2013-04-27 MED ORDER — OXYCODONE-ACETAMINOPHEN 5-325 MG PO TABS: 1.0000 | ORAL_TABLET | ORAL | Status: DC | PRN

## 2013-04-27 MED ORDER — OXYTOCIN 10 UNIT/ML IJ SOLN
INTRAMUSCULAR | Status: AC
  Administered 2013-04-27: 10 [IU] via INTRAMUSCULAR
  Filled 2013-04-27: qty 1

## 2013-04-27 MED ORDER — IBUPROFEN 600 MG PO TABS
600.0000 mg | ORAL_TABLET | Freq: Four times a day (QID) | ORAL | Status: DC
  Administered 2013-04-27 – 2013-04-29 (×8): 600 mg via ORAL
  Filled 2013-04-27 (×8): qty 1

## 2013-04-27 MED ORDER — INFLUENZA VAC SPLIT QUAD 0.5 ML IM SUSP
0.5000 mL | INTRAMUSCULAR | Status: DC
  Filled 2013-04-27: qty 0.5

## 2013-04-27 MED ORDER — FLEET ENEMA 7-19 GM/118ML RE ENEM: 1.0000 | ENEMA | Freq: Every day | RECTAL | Status: DC | PRN

## 2013-04-27 MED ORDER — METHYLERGONOVINE MALEATE 0.2 MG PO TABS: 0.2000 mg | ORAL_TABLET | ORAL | Status: DC | PRN

## 2013-04-27 MED ORDER — LANOLIN HYDROUS EX OINT: TOPICAL_OINTMENT | CUTANEOUS | Status: DC | PRN

## 2013-04-27 MED ORDER — MEASLES, MUMPS & RUBELLA VAC ~~LOC~~ INJ
0.5000 mL | INJECTION | Freq: Once | SUBCUTANEOUS | Status: DC
  Filled 2013-04-27: qty 0.5

## 2013-04-27 MED ORDER — ONDANSETRON HCL 4 MG/2ML IJ SOLN: 4.0000 mg | INTRAMUSCULAR | Status: DC | PRN

## 2013-04-27 MED ORDER — DIPHENHYDRAMINE HCL 25 MG PO CAPS: 25.0000 mg | ORAL_CAPSULE | Freq: Four times a day (QID) | ORAL | Status: DC | PRN

## 2013-04-27 MED ORDER — WITCH HAZEL-GLYCERIN EX PADS: 1.0000 "application " | MEDICATED_PAD | CUTANEOUS | Status: DC | PRN

## 2013-04-27 MED ORDER — ZOLPIDEM TARTRATE 5 MG PO TABS: 5.0000 mg | ORAL_TABLET | Freq: Every evening | ORAL | Status: DC | PRN

## 2013-04-27 MED ORDER — TETANUS-DIPHTH-ACELL PERTUSSIS 5-2.5-18.5 LF-MCG/0.5 IM SUSP: 0.5000 mL | Freq: Once | INTRAMUSCULAR | Status: DC

## 2013-04-27 MED ORDER — BENZOCAINE-MENTHOL 20-0.5 % EX AERO
1.0000 "application " | INHALATION_SPRAY | CUTANEOUS | Status: DC | PRN
  Administered 2013-04-27: 1 via TOPICAL
  Filled 2013-04-27: qty 56

## 2013-04-27 MED ORDER — BISACODYL 10 MG RE SUPP: 10.0000 mg | Freq: Every day | RECTAL | Status: DC | PRN

## 2013-04-27 MED ORDER — SIMETHICONE 80 MG PO CHEW: 80.0000 mg | CHEWABLE_TABLET | ORAL | Status: DC | PRN

## 2013-04-27 NOTE — Progress Notes (Signed)
Called international interpreter line for Arabic [phone] to discuss bath for baby and education.

## 2013-04-27 NOTE — H&P (Signed)
Kelly Luna is a 27 y.o. female presenting for contractions which started around 5 am today and increased in frequency from 10-15 minutes apart to 5 minutes apart. No LOF or vaginal bleeding. Has had good fetal movement.  History OB History   Grav Para Term Preterm Abortions TAB SAB Ect Mult Living   2 2 2       2      Past Medical History  Diagnosis Date  . Thyroid disease 2001    taking no medication  . Hepatitis     Hep B   No past surgical history on file. Family History: family history is not on file. Social History:  reports that she has never smoked. She has never used smokeless tobacco. She reports that she does not drink alcohol or use illicit drugs.   Prenatal Transfer Tool  Maternal Diabetes: No Genetic Screening: Normal Maternal Ultrasounds/Referrals: Abnormal:  Findings:   Other: Bilateral choroid plexus cysts Fetal Ultrasounds or other Referrals:  None Maternal Substance Abuse:  No Significant Maternal Medications:  None Significant Maternal Lab Results:  Lab values include: Group B Strep negative, HBsAG positive Other Comments:  None  Review of Systems  Constitutional: Negative for fever.  Gastrointestinal: Positive for abdominal pain.  Genitourinary: Negative for dysuria.  Neurological: Negative for headaches.    Dilation: 10 Effacement (%): 100 Station: -1 Exam by:: Dorrene German RN Blood pressure 127/73, pulse 60, temperature 97.8 F (36.6 C), temperature source Oral, height 5\' 3"  (1.6 m), weight 56.246 kg (124 lb), last menstrual period 07/25/2012, unknown if currently breastfeeding. Exam Physical Exam  Constitutional: She is oriented to person, place, and time. She appears well-developed and well-nourished. No distress.  Cardiovascular: Normal rate, regular rhythm and normal heart sounds.   No murmur heard. Respiratory: Effort normal and breath sounds normal. No respiratory distress. She has no wheezes. She has no rales.  Musculoskeletal: Normal range  of motion. She exhibits no edema and no tenderness.  Neurological: She is alert and oriented to person, place, and time.  Skin: Skin is warm and dry. She is not diaphoretic. No erythema.    Prenatal labs: ABO, Rh: B/POS/-- (04/10 0925) Antibody: NEG (04/10 0925) Rubella: 9.97 (04/10 0925) RPR: NON REAC (07/03 1238)  HBsAg: POSITIVE (04/10 0925)  HIV: NON REACTIVE (07/03 1238)  GBS: Negative (09/26 0000)   Assessment/Plan: Kelly Luna is a 27 y.o. G2P2002 at [redacted]w[redacted]d who presented in second stage of labor  #Active labor - Admit to birthing suite - routine labor orders - expected NSVD - GBS negative  Kelly Luna 04/27/2013, 9:58 AM  I have seen and examined this patient and agree the above assessment. CRESENZO-DISHMAN,Marit Goodwill 05/01/2013 9:59 AM

## 2013-04-28 NOTE — Lactation Note (Signed)
This note was copied from the chart of Kelly Luna. Lactation Consultation Note  Patient Name: Kelly Wyvonne Carda MVHQI'O Date: 04/28/2013 Reason for consult: Follow-up assessment of this experienced breastfeeding mom who is c/o nipple tenderness.  Nipples are everted and no sign of visible trauma.  LC demonstrated hand expression and Baby and Me breastfeeding pages reviewed, especially pictures showing deep latch.  Mom's sister is present and able to translate and despite mom's breasts being full, there are only scant drops of colostrum expressible.  LC reviewed with mom that colostrum is thick and syrupy and sometimes difficult to express.  LC encouraged continued cue feedings and avoiding supplement by bottle, as this could cause additional latch difficult and nipple soreness.  Mom, through interpreter, verbalizes understanding.  LC encouraged careful latch, cue feedings and encouraged mom to call her nurse for latch assistance; LC as needed.   Maternal Data    Feeding Feeding Type: Breast Milk Length of feed: 30 min  LATCH Score/Interventions         LATCH scores=9 per RN assessment             Lactation Tools Discussed/Used   Cue feedings, hand expression Signs of deep latch  Consult Status Consult Status: Follow-up Date: 04/29/13 Follow-up type: In-patient    [facility] 04/28/2013, 5:00 PM

## 2013-04-28 NOTE — Progress Notes (Signed)
Post Partum Day 1 Subjective: no complaints, up ad lib, voiding, tolerating PO and + flatus  Objective: Blood pressure 104/55, pulse 62, temperature 98.6 F (37 C), temperature source Oral, resp. rate 18, height 5\' 3"  (1.6 m), weight 56.246 kg (124 lb), last menstrual period 07/25/2012, SpO2 100.00%, unknown if currently breastfeeding.  Physical Exam:  General: alert, cooperative and no distress Lochia: appropriate Uterine Fundus: firm Incision: na DVT Evaluation: No evidence of DVT seen on physical exam. No cords or calf tenderness. No significant calf/ankle edema.   Recent Labs  04/27/13 0906  HGB 11.1*  HCT 34.5*    Assessment/Plan: Plan for discharge tomorrow, Lactation consult and Contraception is undecided as she would like to talk to her husband today. she will have a decision by tomorrow   LOS: 1 day   Garth Diffley L 04/28/2013, 7:51 AM

## 2013-04-29 MED ORDER — IBUPROFEN 600 MG PO TABS: 600.0000 mg | ORAL_TABLET | Freq: Four times a day (QID) | ORAL | Status: AC

## 2013-04-29 MED ORDER — OXYCODONE-ACETAMINOPHEN 5-325 MG PO TABS: 1.0000 | ORAL_TABLET | ORAL | Status: AC | PRN

## 2013-04-29 NOTE — Discharge Summary (Signed)
Obstetric Discharge Summary Reason for Admission: onset of labor Prenatal Procedures: ultrasound Intrapartum Procedures: spontaneous vaginal delivery Postpartum Procedures: none Complications-Operative and Postpartum: none Hemoglobin  Date Value Range Status  04/27/2013 11.1* 12.0 - 15.0 g/dL Final     HCT  Date Value Range Status  04/27/2013 34.5* 36.0 - 46.0 % Final    Physical Exam:  General: alert, cooperative, appears stated age and no distress Lochia: appropriate Uterine Fundus: firm Incision: n/a DVT Evaluation: No evidence of DVT seen on physical exam. Negative Homan's sign. No cords or calf tenderness.  Discharge Diagnoses: Term Pregnancy-delivered  Discharge Information: Date: 04/29/2013 Activity: pelvic rest Diet: routine Medications: PNV, Ibuprofen and Percocet Condition: stable and improved Instructions: refer to practice specific booklet Discharge to: home   Newborn Data: Live born female  Birth Weight: 6 lb 4.7 oz (2855 g) APGAR: 8, 9  Home with mother.  Kelly Luna DARLENE 04/29/2013, 8:44 AM

## 2013-04-29 NOTE — Discharge Summary (Signed)
Attestation of Attending Supervision of Advanced Practitioner: Evaluation and management procedures were performed by the PA/NP/CNM/OB Fellow under my supervision/collaboration. Chart reviewed and agree with management and plan.  Sufyan Meidinger V 04/29/2013 6:38 PM    

## 2013-04-30 NOTE — Progress Notes (Signed)
Ur chart review completed.  

## 2013-05-01 ENCOUNTER — Inpatient Hospital Stay (HOSPITAL_COMMUNITY): Admission: RE | Admit: 2013-05-01 | Payer: Medicaid Other | Source: Ambulatory Visit

## 2013-05-01 NOTE — H&P (Signed)
Attestation of Attending Supervision of Advanced Practitioner (PA/CNM/NP): Evaluation and management procedures were performed by the Advanced Practitioner under my supervision and collaboration.  I have reviewed the Advanced Practitioner's note and chart, and I agree with the management and plan.  Alfie Rideaux, MD, FACOG Attending Obstetrician & Gynecologist Faculty Practice, [facility]  

## 2013-05-30 ENCOUNTER — Encounter: Payer: Self-pay | Admitting: Obstetrics & Gynecology

## 2013-05-30 ENCOUNTER — Ambulatory Visit (INDEPENDENT_AMBULATORY_CARE_PROVIDER_SITE_OTHER): Payer: Medicaid Other | Admitting: Obstetrics & Gynecology

## 2013-05-30 VITALS — BP 122/75 | HR 75 | Temp 98.5°F | Ht 62.0 in | Wt 112.9 lb

## 2013-05-30 DIAGNOSIS — IMO0001 Reserved for inherently not codable concepts without codable children: Secondary | ICD-10-CM

## 2013-05-30 DIAGNOSIS — Z30017 Encounter for initial prescription of implantable subdermal contraceptive: Secondary | ICD-10-CM

## 2013-05-30 MED ORDER — ETONOGESTREL 68 MG ~~LOC~~ IMPL
68.0000 mg | DRUG_IMPLANT | Freq: Once | SUBCUTANEOUS | Status: AC
  Administered 2013-05-30: 68 mg via SUBCUTANEOUS

## 2013-05-30 NOTE — Progress Notes (Signed)
Patient ID: Kelly Luna, female   DOB: 02/05/1986, 27 y.o.   MRN: [id number] Subjective:     Kelly Luna is a 27 y.o. female who presents for a postpartum visit. She is 4 weeks postpartum following a spontaneous vaginal delivery. I have fully reviewed the prenatal and intrapartum course. The delivery was at 39 gestational weeks. Outcome: spontaneous vaginal delivery. Anesthesia: none. Postpartum course has been uncomplicated. Baby's course has been uncomplicated. Baby is feeding by breast. Bleeding staining only. Bowel function is normal. Bladder function is normal. Patient is not sexually active. Contraception method is abstinence. Postpartum depression screening: negative. Pt denies problems.  The following portions of the patient's history were reviewed and updated as appropriate: allergies, current medications, past family history, past medical history, past social history, past surgical history and problem list.  Review of Systems Pertinent items are noted in HPI.   Objective:    BP 122/75  Pulse 75  Temp(Src) 98.5 F (36.9 C) (Oral)  Ht 5\' 2"  (1.575 m)  Wt 112 lb 14.4 oz (51.211 kg)  BMI 20.64 kg/m2  Breastfeeding? Yes  General:  alert and no distress     Lungs: clear to auscultation bilaterally  Heart:  regular rate and rhythm, S1, S2 normal, no murmur, click, rub or gallop  Abdomen: soft, non-tender; bowel sounds normal; no masses,  no organomegaly   Vulva:  not evaluated  Vagina: not evaluated                   Patient given informed consent, she signed consent form. Pregnancy test was negative.  Appropriate time out taken.  Patient's left arm was prepped and draped in the usual sterile fashion.. The ruler used to measure and mark insertion area.  Patient was prepped with alcohol swab and then injected with 5 ml of 1 % lidocaine.  She was prepped with betadine, Nexplanon removed from packaging,  Device confirmed in needle, then inserted full length of needle and withdrawn  per handbook instructions.  There was minimal blood loss.  Patient insertion site covered with guaze and a pressure bandage to reduce any bruising.   Assessment:     4 weeks postpartum exam. Pap smear not done at today's visit.   Plan:    1. Contraception: Nexplanon 2.  The patient tolerated the procedure well and was given post procedure instructions. Return in about one month for Nexplanon check. 3. Follow up in: 4 weeks or as needed.

## 2013-05-30 NOTE — Patient Instructions (Signed)
ETONOGESTREL     ( ).    .      3 .       .          .                           :    -abnormal  -blood       -Cancer       -depression  -diabetes  -gallbladder   -headaches   -heart              -kidney   -liver   -renal  -seizures   -tobacco        - etonogestrel             -pregnant        -breast                          .               .     .   :                      .   :                      .  :    .      .         .                 :  -amprenavir  -bosentan  -fosamprenavir         :    -barbiturate           -certain   -griseofulvin  -medicines     felbamate     -modafinil  -phenylbutazone  -rifampin  -              tipranavir   -ST.             .                   .             .        .                     ()      .                   .         .                 '            :    -allergic                -breast  -changes    -Confusion         -dark   -depressed    -general          -loss           ?   ?         -shortness          -signs      -sudden        -trouble         -unusual    -  unusually    -yellowing              (                .)  -acne    -breast  -changes    -cough  -fever    -headache    -irregular  -itching         -pain  -sore           .          acerca.           1-800-FDA-1088.                   .  :    .      .                 .   2013  /  . (2009/04/11 03:54:17)

## 2013-06-06 ENCOUNTER — Encounter: Payer: Self-pay | Admitting: *Deleted

## 2013-06-25 ENCOUNTER — Ambulatory Visit (INDEPENDENT_AMBULATORY_CARE_PROVIDER_SITE_OTHER): Payer: Medicaid Other | Admitting: Obstetrics & Gynecology

## 2013-06-25 ENCOUNTER — Encounter: Payer: Self-pay | Admitting: Obstetrics & Gynecology

## 2013-06-25 VITALS — BP 108/75 | HR 65 | Temp 98.9°F | Wt 111.3 lb

## 2013-06-25 DIAGNOSIS — Z975 Presence of (intrauterine) contraceptive device: Secondary | ICD-10-CM

## 2013-06-25 NOTE — Progress Notes (Signed)
Subjective:     Patient ID: Kelly Luna, female   DOB: [birth year], 27 y.o.   MRN: [id number]  HPI Pt with Nexplanon placed 05/30/2013.  Pt with irr cycles as expected but, no problems.  Still nursing.    Review of Systems     Objective:   Physical Exam BP 108/75  Pulse 65  Temp(Src) 98.9 F (37.2 C) (Oral)  Wt 111 lb 4.8 oz (50.485 kg)  Breastfeeding? Yes  Left arm: implant palpable.  Nontender; inplace     Assessment:     Nexplanon check- no problems     Plan:     F/u 1 year or sooner prn

## 2013-06-25 NOTE — Patient Instructions (Signed)
Etonogestrel implant What is this medicine? ETONOGESTREL (et oh noe JES trel) is a contraceptive (birth control) device. It is used to prevent pregnancy. It can be used for up to 3 years. This medicine may be used for other purposes; ask your health care provider or pharmacist if you have questions. COMMON BRAND NAME(S): Implanon, Nexplanon  What should I tell my health care provider before I take this medicine? They need to know if you have any of these conditions: -abnormal vaginal bleeding -blood vessel disease or blood clots -cancer of the breast, cervix, or liver -depression -diabetes -gallbladder disease -headaches -heart disease or recent heart attack -high blood pressure -high cholesterol -kidney disease -liver disease -renal disease -seizures -tobacco smoker -an unusual or allergic reaction to etonogestrel, other hormones, anesthetics or antiseptics, medicines, foods, dyes, or preservatives -pregnant or trying to get pregnant -breast-feeding How should I use this medicine? This device is inserted just under the skin on the inner side of your upper arm by a health care professional. Talk to your pediatrician regarding the use of this medicine in children. Special care may be needed. Overdosage: If you think you've taken too much of this medicine contact a poison control center or emergency room at once. Overdosage: If you think you have taken too much of this medicine contact a poison control center or emergency room at once. NOTE: This medicine is only for you. Do not share this medicine with others. What if I miss a dose? This does not apply. What may interact with this medicine? Do not take this medicine with any of the following medications: -amprenavir -bosentan -fosamprenavir This medicine may also interact with the following medications: -barbiturate medicines for inducing sleep or treating seizures -certain medicines for fungal infections like ketoconazole and  itraconazole -griseofulvin -medicines to treat seizures like carbamazepine, felbamate, oxcarbazepine, phenytoin, topiramate -modafinil -phenylbutazone -rifampin -some medicines to treat HIV infection like atazanavir, indinavir, lopinavir, nelfinavir, tipranavir, ritonavir -St. John's wort This list may not describe all possible interactions. Give your health care provider a list of all the medicines, herbs, non-prescription drugs, or dietary supplements you use. Also tell them if you smoke, drink alcohol, or use illegal drugs. Some items may interact with your medicine. What should I watch for while using this medicine? This product does not protect you against HIV infection (AIDS) or other sexually transmitted diseases. You should be able to feel the implant by pressing your fingertips over the skin where it was inserted. Tell your doctor if you cannot feel the implant. What side effects may I notice from receiving this medicine? Side effects that you should report to your doctor or health care professional as soon as possible: -allergic reactions like skin rash, itching or hives, swelling of the face, lips, or tongue -breast lumps -changes in vision -confusion, trouble speaking or understanding -dark urine -depressed mood -general ill feeling or flu-like symptoms -light-colored stools -loss of appetite, nausea -right upper belly pain -severe headaches -severe pain, swelling, or tenderness in the abdomen -shortness of breath, chest pain, swelling in a leg -signs of pregnancy -sudden numbness or weakness of the face, arm or leg -trouble walking, dizziness, loss of balance or coordination -unusual vaginal bleeding, discharge -unusually weak or tired -yellowing of the eyes or skin Side effects that usually do not require medical attention (Report these to your doctor or health care professional if they continue or are bothersome.): -acne -breast pain -changes in  weight -cough -fever or chills -headache -irregular menstrual bleeding -itching, burning,   and vaginal discharge -pain or difficulty passing urine -sore throat This list may not describe all possible side effects. Call your doctor for medical advice about side effects. You may report side effects to FDA at 1-800-FDA-1088. Where should I keep my medicine? This drug is given in a hospital or clinic and will not be stored at home. NOTE: This sheet is a summary. It may not cover all possible information. If you have questions about this medicine, talk to your doctor, pharmacist, or health care provider.  2014, [employer]. (2012-01-24 15:37:45)  

## 2014-04-14 IMAGING — US US OB COMP +14 WK
1 series · 12 of 28 positions shown · non-contrast
Comparison: none

[Series 1: us ob comp +14 wk · 70 acquisitions, 12 frames shown]
[im 3/70]
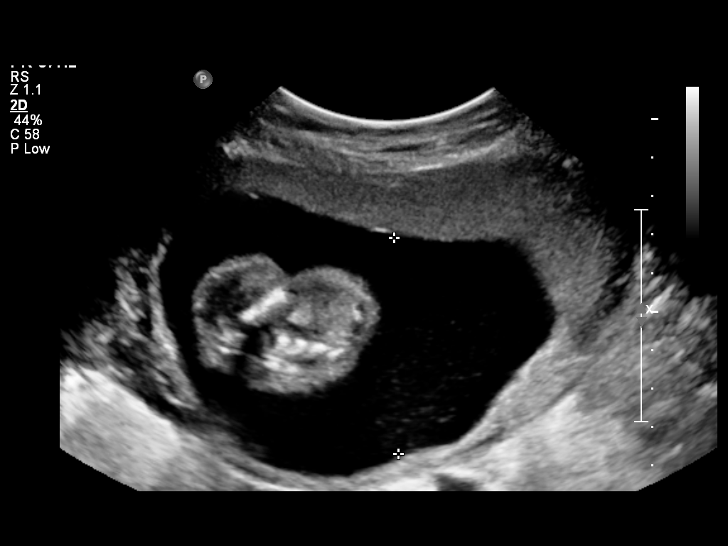
[im 8/70]
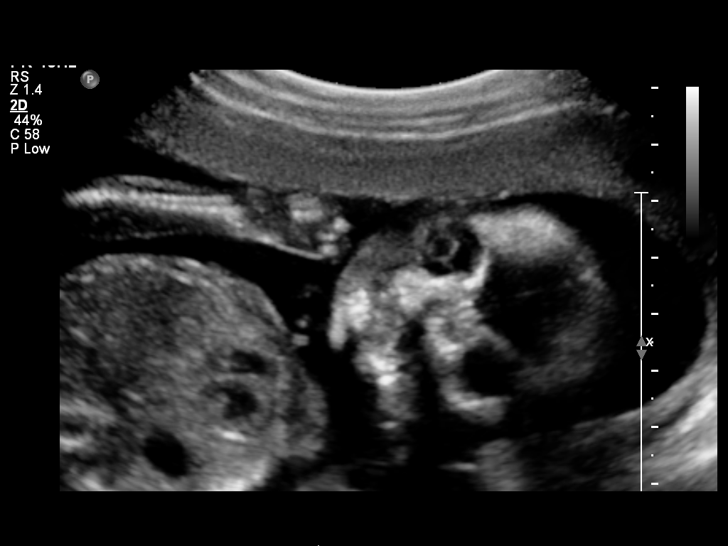
[im 13/70]
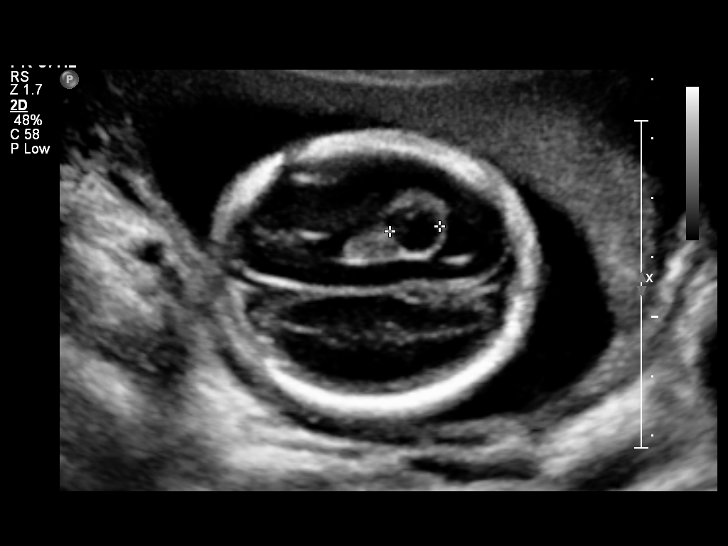
[im 21/70]
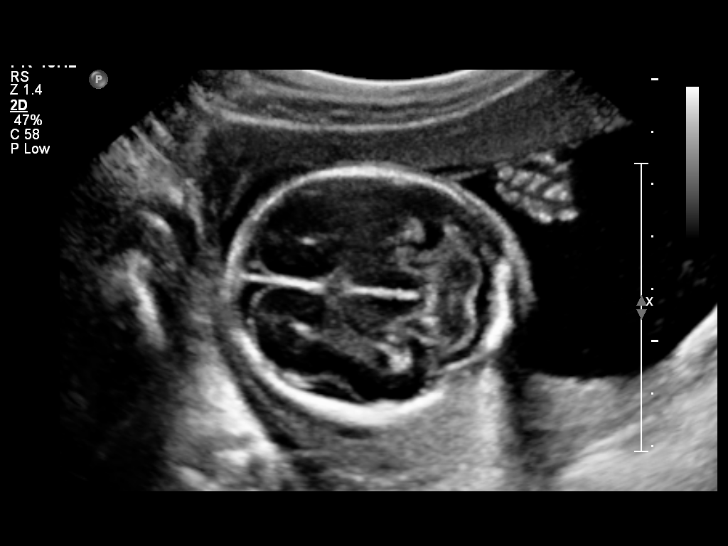
[im 26/70]
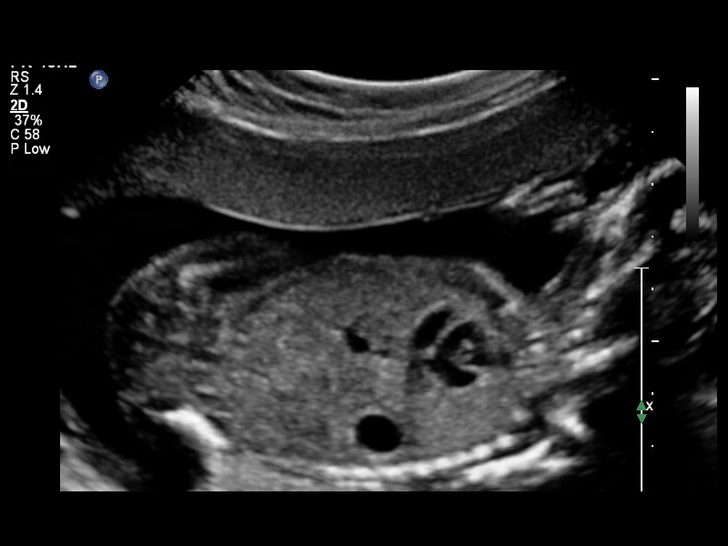
[im 31/70]
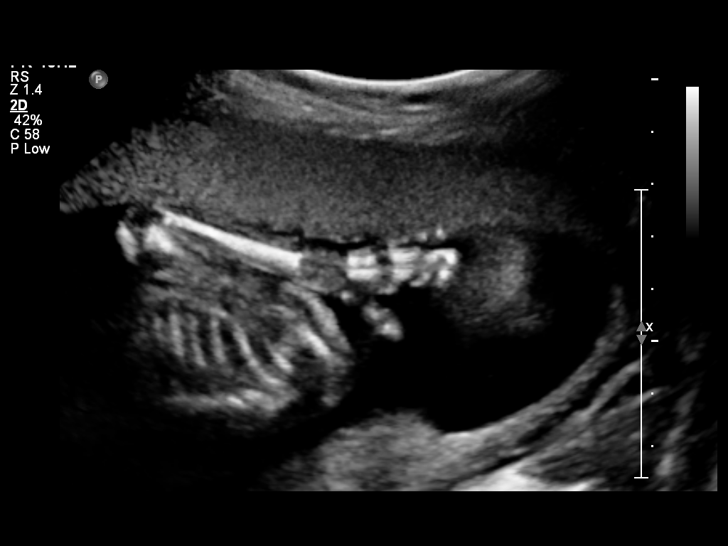
[im 39/70]
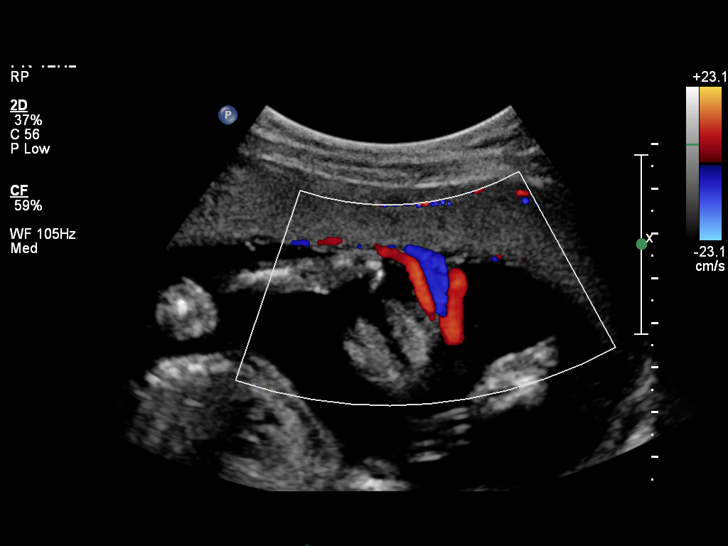
[im 44/70]
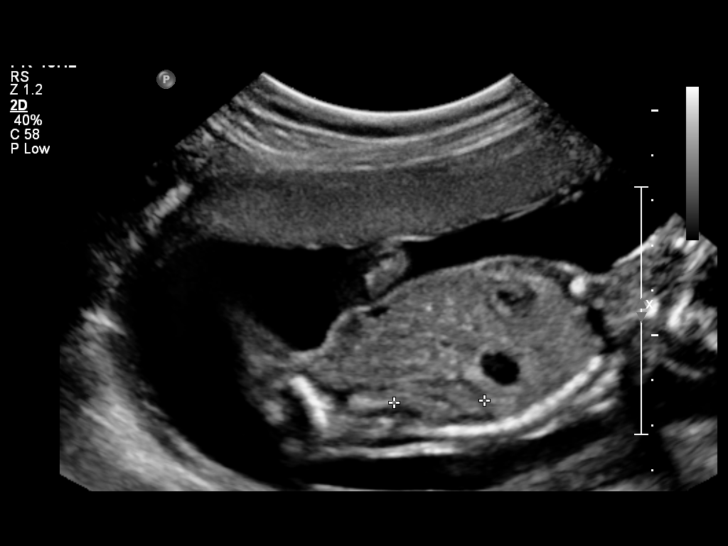
[im 49/70]
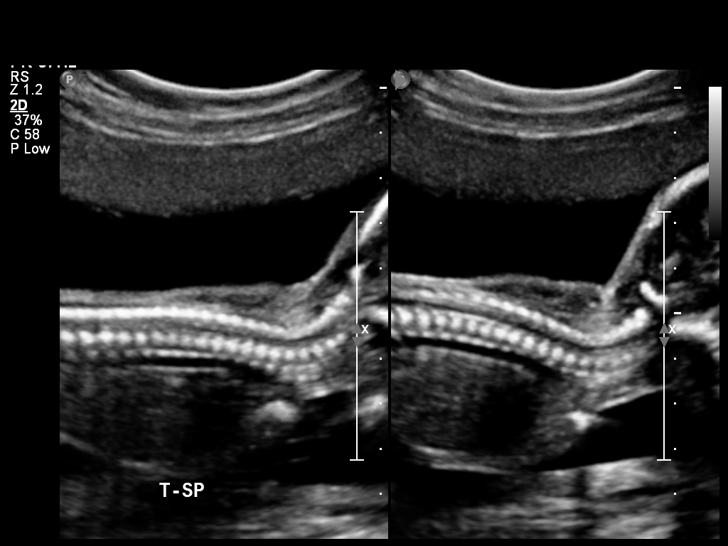
[im 57/70]
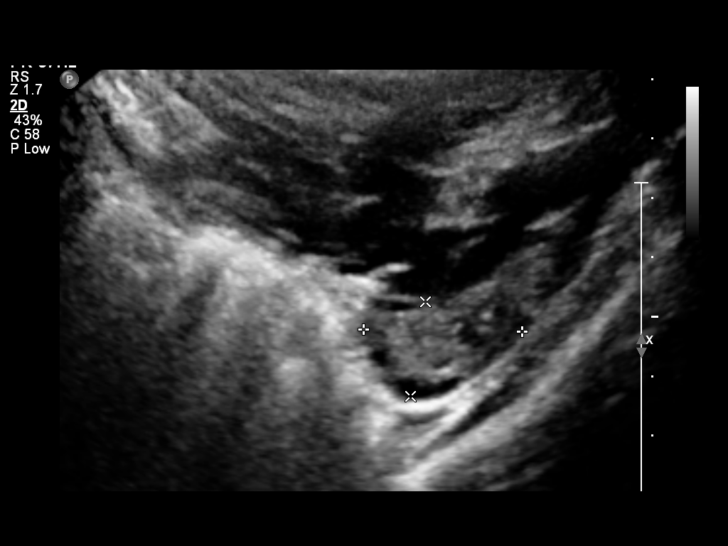
[im 62/70]
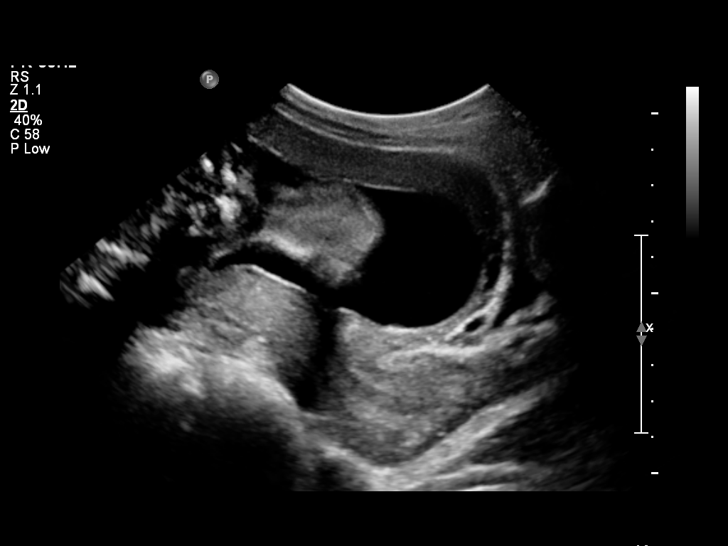
[im 67/70]
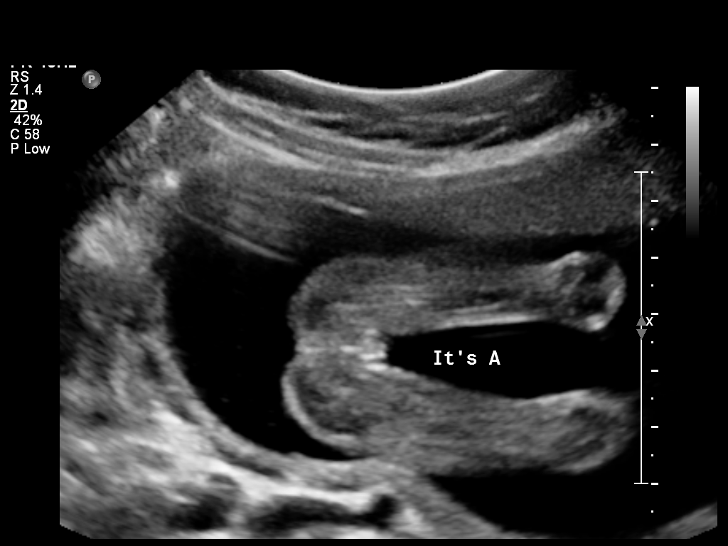

[12 of 28 positions shown; findings below may reference images not displayed]

OBSTETRICS REPORT
                      (Signed Final 12/07/2012 [DATE])

 Name:       JHEMBOY PADAM                  Visit Date:  12/07/2012 [DATE]

Service(s) Provided

 US OB COMP + 14 WK                                    76805.1
Indications

 Basic anatomic survey
Fetal Evaluation

 Num Of Fetuses:    1
 Fetal Heart Rate:  139                         bpm
 Cardiac Activity:  Observed
 Presentation:      Cephalic
 Placenta:          Anterior, above cervical os

 Amniotic Fluid
 AFI FV:      Subjectively within normal limits
                                             Larg Pckt:     5.6  cm
Biometry

 BPD:     45.8  mm    G. Age:   19w 6d                CI:         79.9   70 - 86
 OFD:     57.3  mm                                    FL/HC:      19.6   16.1 -

 HC:     171.2  mm    G. Age:   19w 5d       63  %    HC/AC:      1.21   1.09 -

 AC:     141.4  mm    G. Age:   19w 4d       53  %    FL/BPD:
 FL:      33.5  mm    G. Age:   20w 4d       82  %    FL/AC:      23.7   20 - 24
 HUM:     32.6  mm    G. Age:   21w 0d     > 95  %
 CER:     19.1  mm    G. Age:   18w 4d       30  %
 Est. FW:     322  gm    0 lb 11 oz      56  %
Gestational Age

 LMP:           19w 2d       Date:   07/25/12                 EDD:   05/01/13
 U/S Today:     19w 6d                                        EDD:   04/27/13
 Best:          19w 2d    Det. By:   LMP  (07/25/12)          EDD:   05/01/13
Anatomy

 Cranium:          Appears normal         Aortic Arch:      Basic anatomy
                                                            exam per order
 Fetal Cavum:      Appears normal         Ductal Arch:      Basic anatomy
                                                            exam per order
 Ventricles:       Appears normal         Diaphragm:        Appears normal
 Choroid Plexus:   Bilateral choroid      Stomach:          Appears normal
                   plexus cysts
 Cerebellum:       Appears normal         Abdomen:          Appears normal
 Posterior Fossa:  Appears normal         Abdominal Wall:   Appears nml (cord
                                                            insert, abd wall)
 Nuchal Fold:      Appears normal         Cord Vessels:     Appears normal (3
                                                            vessel cord)
 Face:             Appears normal         Kidneys:          Appear normal
                   (orbits and profile)
 Lips:             Appears normal         Bladder:          Appears normal
 Heart:            Appears normal         Spine:            Appears normal
                   (4CH, axis, and
                   situs)
 RVOT:             Appears normal         Lower             Appears normal
                                          Extremities:
 LVOT:             Appears normal         Upper             Appears normal
                                          Extremities:

 Other:  Heels and 5th digit visualized.  Fetus appears to be a female.
Cervix Uterus Adnexa

 Cervical Length:   4.13      cm

 Cervix:       Normal appearance by transabdominal scan.

 Left Ovary:   Size(cm) L: 2.53 x W: 2.02 x H: 1.73  Volume(cc):
 Right Ovary:  Size(cm) L: 2.65 x W: 2.32 x H: 1.6  Volume(cc):
Impression

 Single live IUP in cephalic presentation.  Concordant
 measurements/assigned GA by LMP.
 Bilateral choroid plexus cysts, as large as 8mm. When
 multiple, these may increase the risk of being a soft marker
 for possible aneuploidy. No other anatomic abnormality is
 identified. Consider dedicated screening for aneuploidy if not
 already perfomed.

 questions or concerns.

## 2014-05-20 ENCOUNTER — Encounter: Payer: Self-pay | Admitting: Obstetrics & Gynecology

## 2014-05-20 ENCOUNTER — Ambulatory Visit (INDEPENDENT_AMBULATORY_CARE_PROVIDER_SITE_OTHER): Payer: Self-pay | Admitting: Obstetrics & Gynecology

## 2014-05-20 VITALS — BP 103/64 | HR 56 | Wt 105.6 lb

## 2014-05-20 DIAGNOSIS — N921 Excessive and frequent menstruation with irregular cycle: Secondary | ICD-10-CM | POA: Insufficient documentation

## 2014-05-20 DIAGNOSIS — Z975 Presence of (intrauterine) contraceptive device: Secondary | ICD-10-CM

## 2014-05-20 MED ORDER — MEDROXYPROGESTERONE ACETATE 10 MG PO TABS: 20.0000 mg | ORAL_TABLET | Freq: Every day | ORAL | Status: AC

## 2014-05-20 NOTE — Patient Instructions (Signed)
Etonogestrel implant What is this medicine? ETONOGESTREL (et oh noe JES trel) is a contraceptive (birth control) device. It is used to prevent pregnancy. It can be used for up to 3 years. This medicine may be used for other purposes; ask your health care provider or pharmacist if you have questions. COMMON BRAND NAME(S): Implanon, Nexplanon What should I tell my health care provider before I take this medicine? They need to know if you have any of these conditions: -abnormal vaginal bleeding -blood vessel disease or blood clots -cancer of the breast, cervix, or liver -depression -diabetes -gallbladder disease -headaches -heart disease or recent heart attack -high blood pressure -high cholesterol -kidney disease -liver disease -renal disease -seizures -tobacco smoker -an unusual or allergic reaction to etonogestrel, other hormones, anesthetics or antiseptics, medicines, foods, dyes, or preservatives -pregnant or trying to get pregnant -breast-feeding How should I use this medicine? This device is inserted just under the skin on the inner side of your upper arm by a health care professional. Talk to your pediatrician regarding the use of this medicine in children. Special care may be needed. Overdosage: If you think you've taken too much of this medicine contact a poison control center or emergency room at once. Overdosage: If you think you have taken too much of this medicine contact a poison control center or emergency room at once. NOTE: This medicine is only for you. Do not share this medicine with others. What if I miss a dose? This does not apply. What may interact with this medicine? Do not take this medicine with any of the following medications: -amprenavir -bosentan -fosamprenavir This medicine may also interact with the following medications: -barbiturate medicines for inducing sleep or treating seizures -certain medicines for fungal infections like ketoconazole and  itraconazole -griseofulvin -medicines to treat seizures like carbamazepine, felbamate, oxcarbazepine, phenytoin, topiramate -modafinil -phenylbutazone -rifampin -some medicines to treat HIV infection like atazanavir, indinavir, lopinavir, nelfinavir, tipranavir, ritonavir -St. John's wort This list may not describe all possible interactions. Give your health care provider a list of all the medicines, herbs, non-prescription drugs, or dietary supplements you use. Also tell them if you smoke, drink alcohol, or use illegal drugs. Some items may interact with your medicine. What should I watch for while using this medicine? This product does not protect you against HIV infection (AIDS) or other sexually transmitted diseases. You should be able to feel the implant by pressing your fingertips over the skin where it was inserted. Tell your doctor if you cannot feel the implant. What side effects may I notice from receiving this medicine? Side effects that you should report to your doctor or health care professional as soon as possible: -allergic reactions like skin rash, itching or hives, swelling of the face, lips, or tongue -breast lumps -changes in vision -confusion, trouble speaking or understanding -dark urine -depressed mood -general ill feeling or flu-like symptoms -light-colored stools -loss of appetite, nausea -right upper belly pain -severe headaches -severe pain, swelling, or tenderness in the abdomen -shortness of breath, chest pain, swelling in a leg -signs of pregnancy -sudden numbness or weakness of the face, arm or leg -trouble walking, dizziness, loss of balance or coordination -unusual vaginal bleeding, discharge -unusually weak or tired -yellowing of the eyes or skin Side effects that usually do not require medical attention (Report these to your doctor or health care professional if they continue or are bothersome.): -acne -breast pain -changes in  weight -cough -fever or chills -headache -irregular menstrual bleeding -itching, burning, and   vaginal discharge -pain or difficulty passing urine -sore throat This list may not describe all possible side effects. Call your doctor for medical advice about side effects. You may report side effects to FDA at 1-800-FDA-1088. Where should I keep my medicine? This drug is given in a hospital or clinic and will not be stored at home. NOTE: This sheet is a summary. It may not cover all possible information. If you have questions about this medicine, talk to your doctor, pharmacist, or health care provider.  2015, [employer]/Gold Standard. (2012-01-24 15:37:45)  

## 2014-05-20 NOTE — Progress Notes (Signed)
Patient states that she is having periods that last for 15 days, and sometimes she has a period twice a month since having the nexplanon.

## 2014-05-20 NOTE — Progress Notes (Signed)
   Subjective:    Patient ID: Kelly Luna, female    DOB: 06/29/1986, 28 y.o.   MRN: [id number]  [id number] No LMP recorded. Previous menses started mid Sept and lasted until 10/10. No bleeding now but menses becoming prolonged and irregular with Nexplanon in place.  No Known Allergies Past Medical History  Diagnosis Date  . Thyroid disease 2001    taking no medication  . Hepatitis     Hep B   History   Social History  . Marital Status: Married    Spouse Name: N/A    Number of Children: N/A  . Years of Education: N/A   Occupational History  . Not on file.   Social History Main Topics  . Smoking status: Never Smoker   . Smokeless tobacco: Never Used  . Alcohol Use: No  . Drug Use: No  . Sexual Activity: Yes    Birth Control/ Protection: Implant   Other Topics Concern  . Not on file   Social History Narrative  . No narrative on file       Review of Systems  Constitutional: Negative.   Genitourinary: Positive for menstrual problem. Negative for vaginal discharge and pelvic pain.       Objective:   Physical Exam  Nursing note and vitals reviewed. Constitutional: She is oriented to person, place, and time. She appears well-developed. No distress.  Pulmonary/Chest: No respiratory distress.  Neurological: She is alert and oriented to person, place, and time.  Psychiatric: She has a normal mood and affect. Her behavior is normal.          Assessment & Plan:  BTB with Nexplanon. Provera 20 mg daily for 30 days and advised to report if no improvement.  Adam PhenixJames G Latrese Carolan, MD 05/20/2014

## 2014-06-03 ENCOUNTER — Encounter: Payer: Self-pay | Admitting: Obstetrics & Gynecology

## 2014-06-30 IMAGING — US US OB FOLLOW-UP
1 series · 12 of 28 positions shown · non-contrast
Comparison: none

[Series 1: us ob follow-up · 0.11mm/px · 12 of 34 slices shown]
[im 2/34]
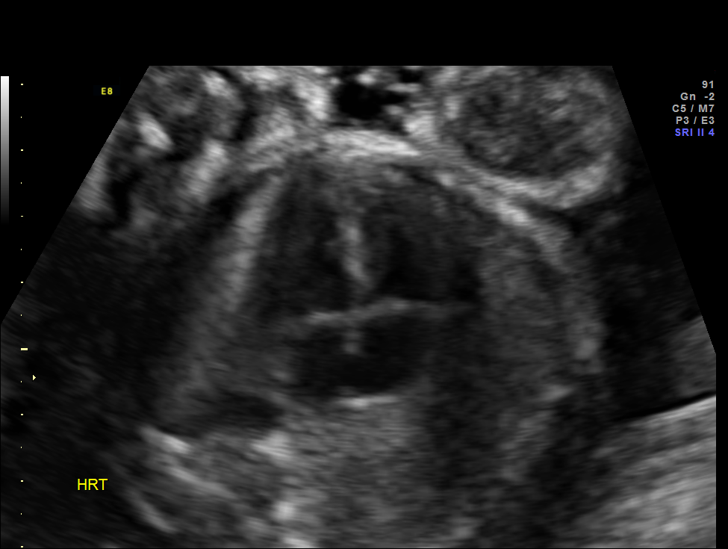
[im 4/34]
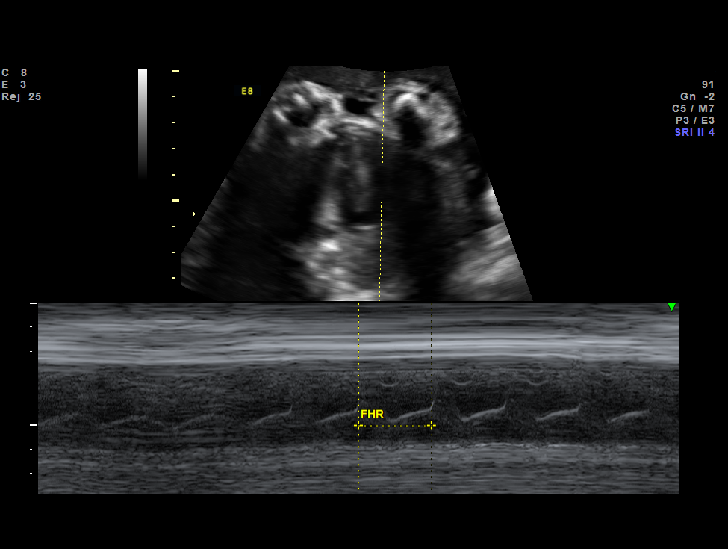
[im 7/34]
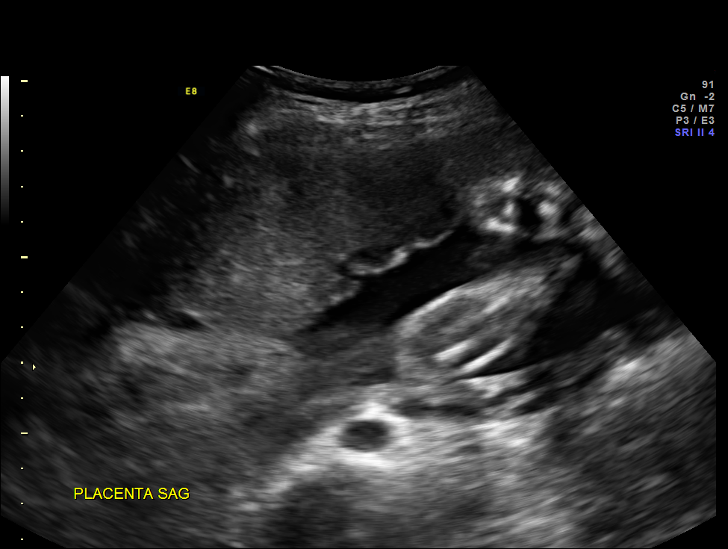
[im 10/34]
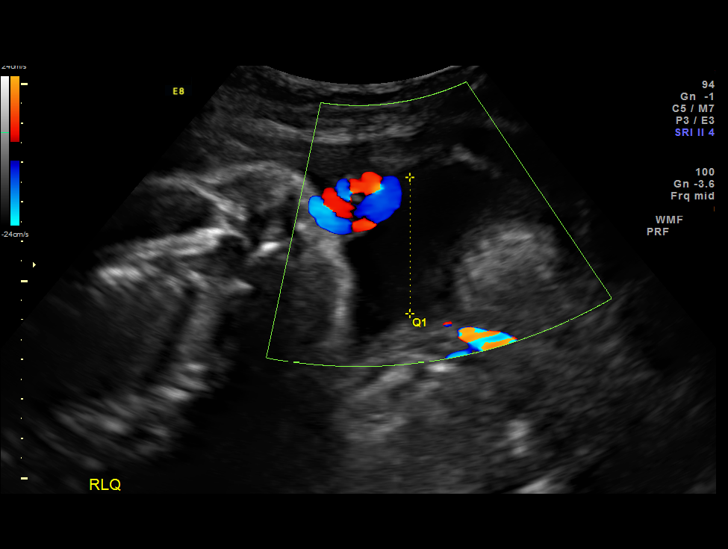
[im 13/34]
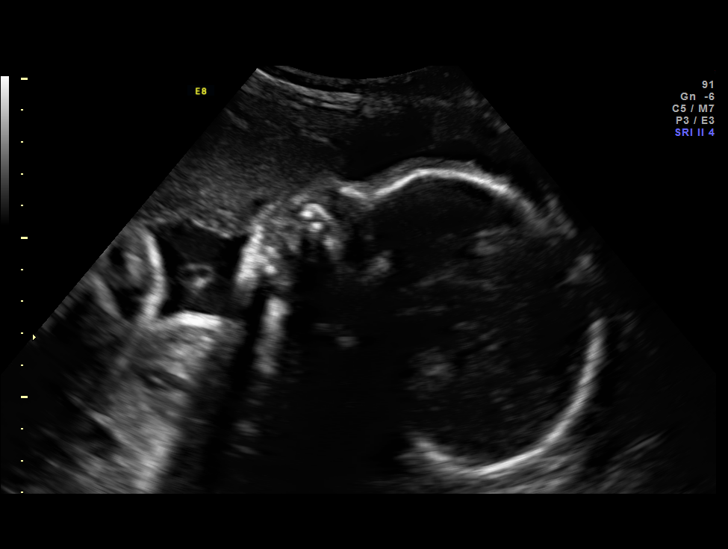
[im 15/34]
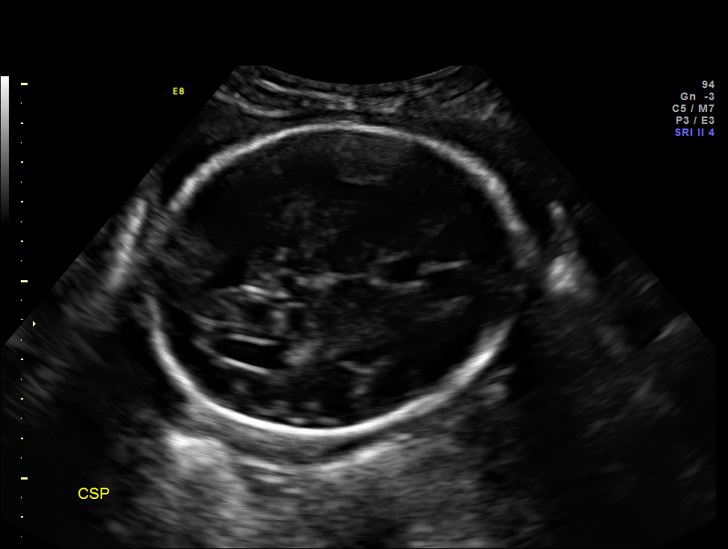
[im 19/34]
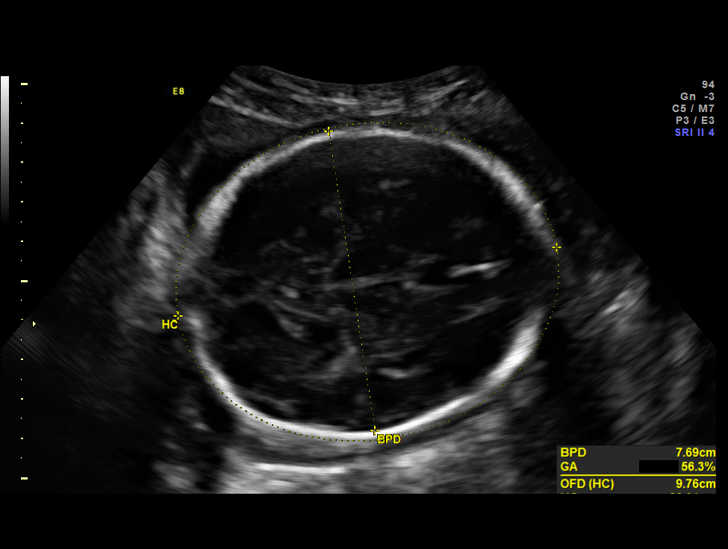
[im 21/34]
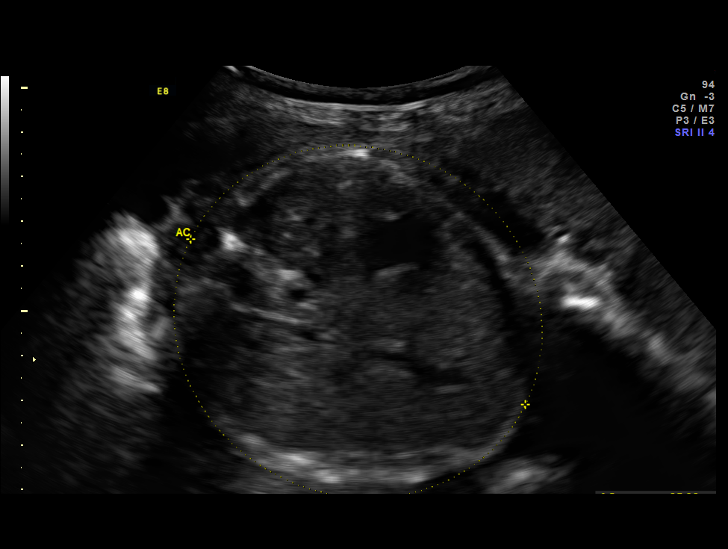
[im 24/34]
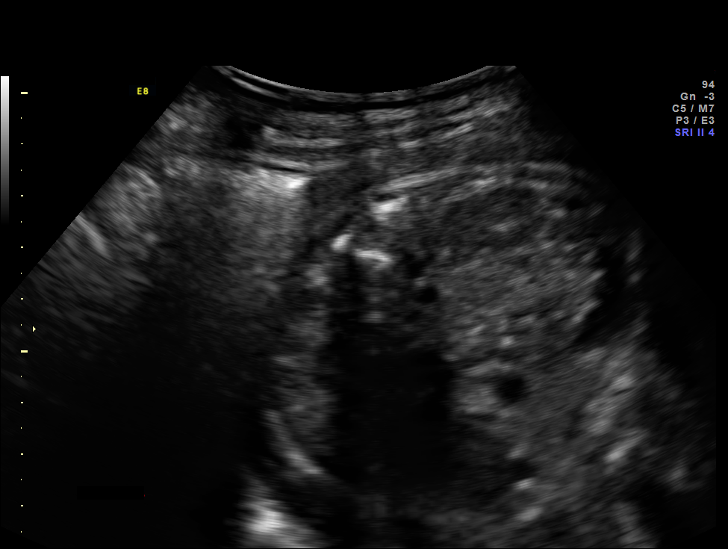
[im 27/34]
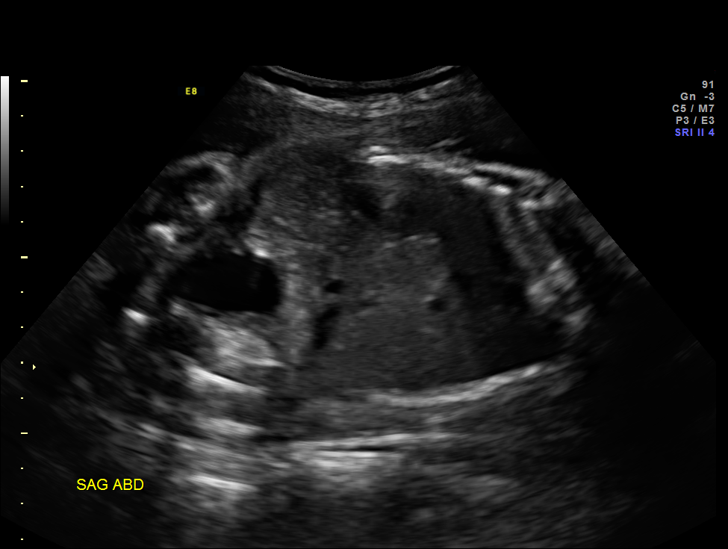
[im 30/34]
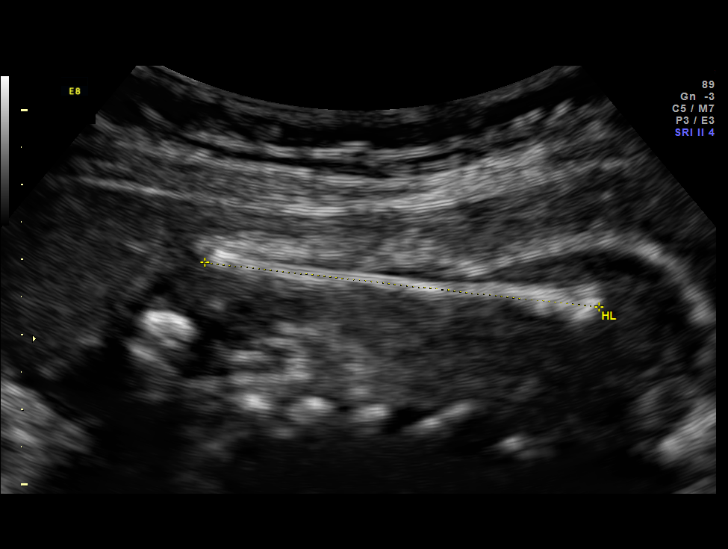
[im 32/34]
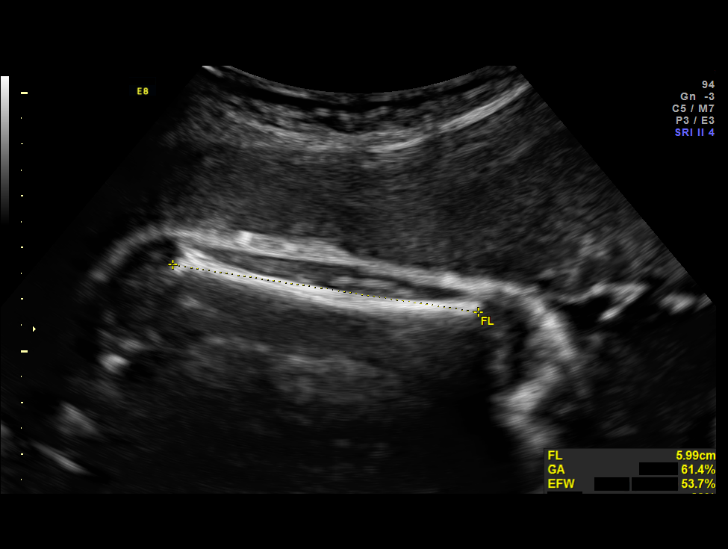

[12 of 28 positions shown; findings below may reference images not displayed]

OBSTETRICS REPORT
                      (Signed Final 02/22/2013 [DATE])

 Name:       NOMASIBULELE MOATSHE                   Visit Date: 02/22/2013 [DATE]

Service(s) Provided

 US OB FOLLOW UP                                       76816.1
Indications

 Bilateral Choroid plexus cysts - negative QUAD
 screen
 Hepatitis B
 Poor obstetric history: Previous fetal growth
 restriction (FGR)
Fetal Evaluation

 Num Of Fetuses:    1
 Fetal Heart Rate:  138                          bpm
 Cardiac Activity:  Observed
 Presentation:      Cephalic
 Placenta:          Anterior, above cervical os
 P. Cord            Previously Visualized
 Insertion:

 Amniotic Fluid
 AFI FV:      Subjectively within normal limits
 AFI Sum:     10.06   cm       14  %Tile     Larg Pckt:    3.53  cm
 RUQ:   3.46    cm   LUQ:    3.07   cm    LLQ:   3.53    cm
Biometry

 BPD:     76.7  mm     G. Age:  30w 5d                CI:         79.0   70 - 86
 OFD:     97.1  mm                                    FL/HC:      21.5   19.2 -

 HC:     278.8  mm     G. Age:  30w 3d       22  %    HC/AC:      1.09   0.99 -

 AC:     255.2  mm     G. Age:  29w 5d       28  %    FL/BPD:     78.2   71 - 87
 FL:        60  mm     G. Age:  31w 1d       62  %    FL/AC:      23.5   20 - 24
 HUM:     53.1  mm     G. Age:  31w 0d       65  %

 Est. FW:    6335  gm      3 lb 7 oz     55  %
Gestational Age

 LMP:           30w 2d        Date:  07/25/12                 EDD:   05/01/13
 U/S Today:     30w 3d                                        EDD:   04/30/13
 Best:          30w 2d     Det. By:  LMP  (07/25/12)          EDD:   05/01/13
Anatomy

 Cranium:          Appears normal         Aortic Arch:      Previously seen
 Fetal Cavum:      Appears normal         Ductal Arch:      Previously seen
 Ventricles:       Appears normal         Diaphragm:        Previously seen
 Choroid Plexus:   Resolved CPC           Stomach:          Appears normal, left
                                                            sided
 Cerebellum:       Previously seen        Abdomen:          Appears normal
 Posterior Fossa:  Previously seen        Abdominal Wall:   Previously seen
 Nuchal Fold:      Not applicable (>20    Cord Vessels:     Previously seen
                   wks GA)
 Face:             Profile appears NL,    Kidneys:          Appear normal
                   orbits prev seen
 Lips:             Previously seen        Bladder:          Appears normal
 Heart:            Appears normal         Spine:            Previously seen
                   (4CH, axis, and
                   situs)
 RVOT:             Previously seen        Lower             Previously seen
                                          Extremities:
 LVOT:             Previously seen        Upper             Previously seen
                                          Extremities:

 Other:  Female gender previously seen. Heels and 5th digit previously seen.
         Nasal bone visualized.
Cervix Uterus Adnexa

 Cervix:       Not visualized (advanced GA >98wks)

 Left Ovary:    Within normal limits.
 Right Ovary:   Within normal limits.
 Adnexa:     No abnormality visualized.
Impression

 Single IUP at 30 [DATE] weeks
 HepB surface antigen positive, hx of prior SGA infant
 Normal interval anatomy
 Fetal growth is appropriate (55th %tile)
 Normal amniotic fluid.
Recommendations

 Recommend follow-up ultrasound examination in 4 weeks for
 interval growth.

 questions or concerns.

## 2014-08-18 IMAGING — US US OB FOLLOW-UP
1 series · 12 of 28 positions shown · non-contrast
Comparison: none

[Series 1: us ob follow-up · 0.21mm/px · 12 of 37 slices shown]
[im 2/37]
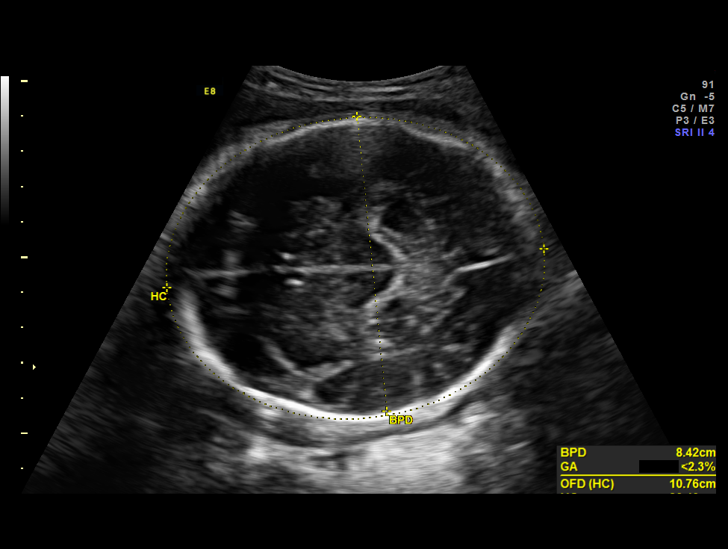
[im 5/37]
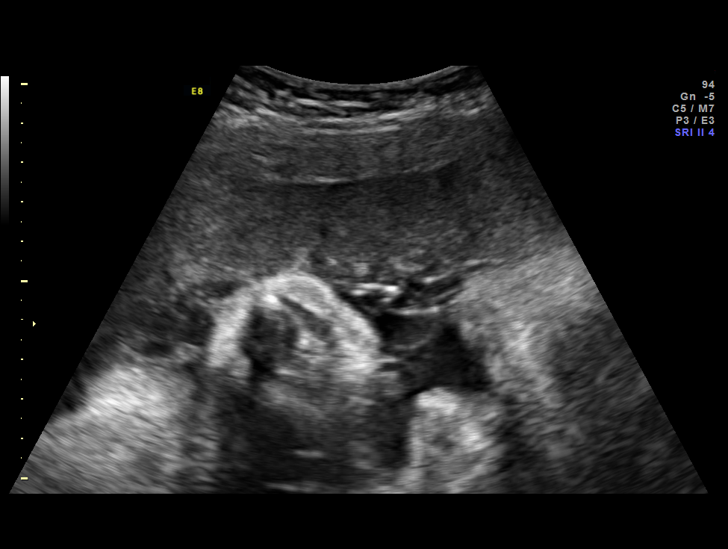
[im 7/37]
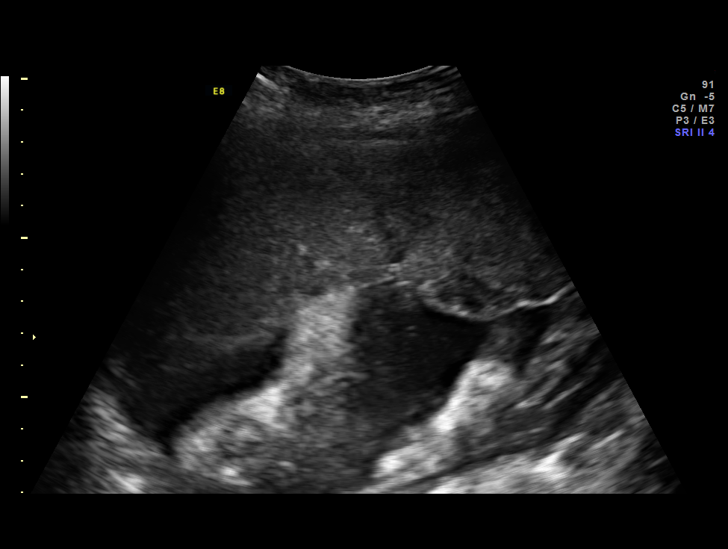
[im 11/37]
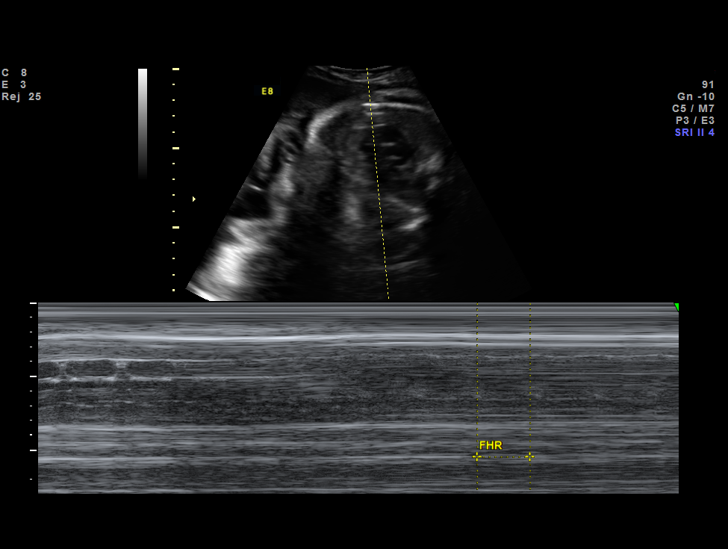
[im 14/37]
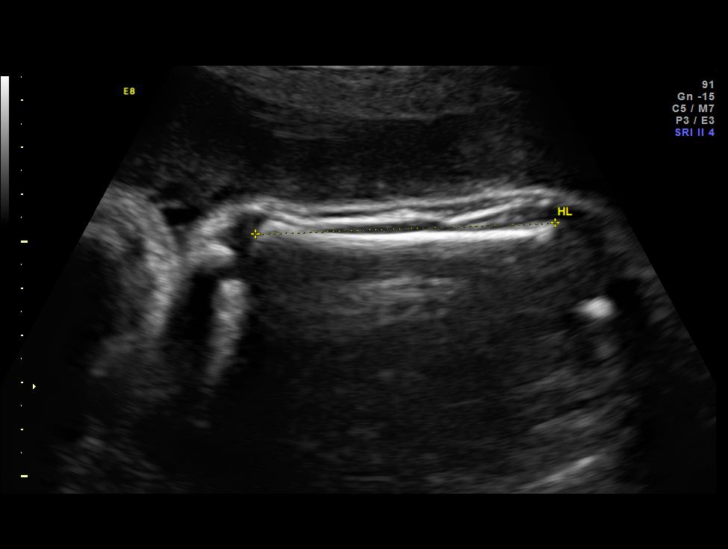
[im 17/37]
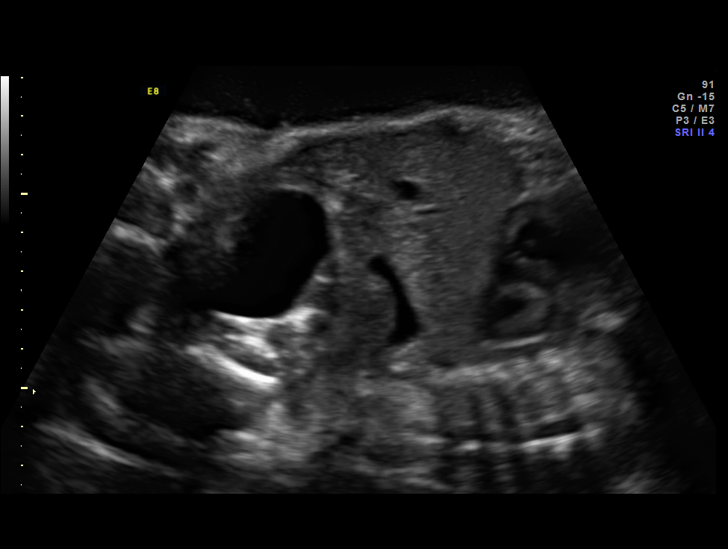
[im 21/37]
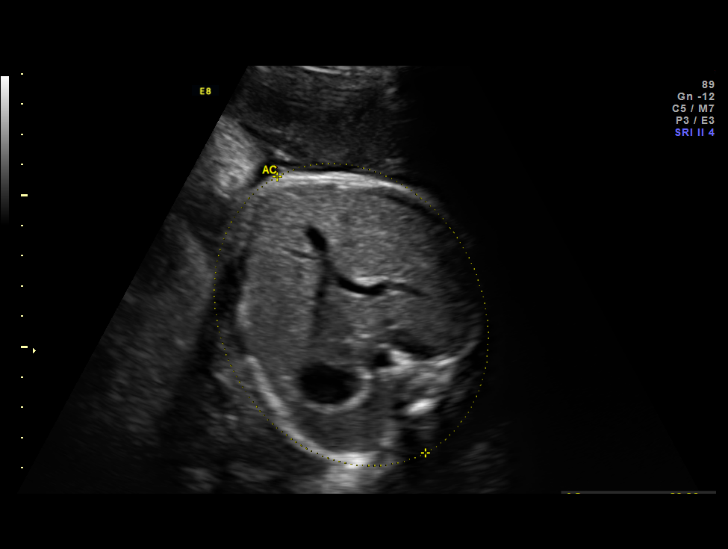
[im 23/37]
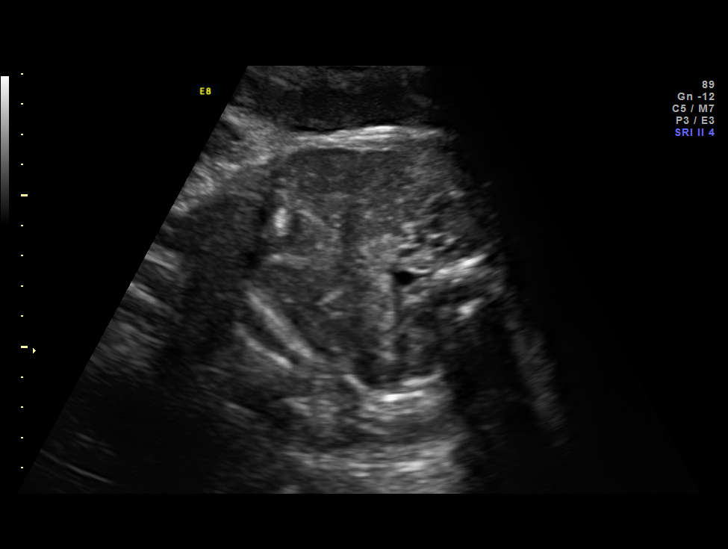
[im 26/37]
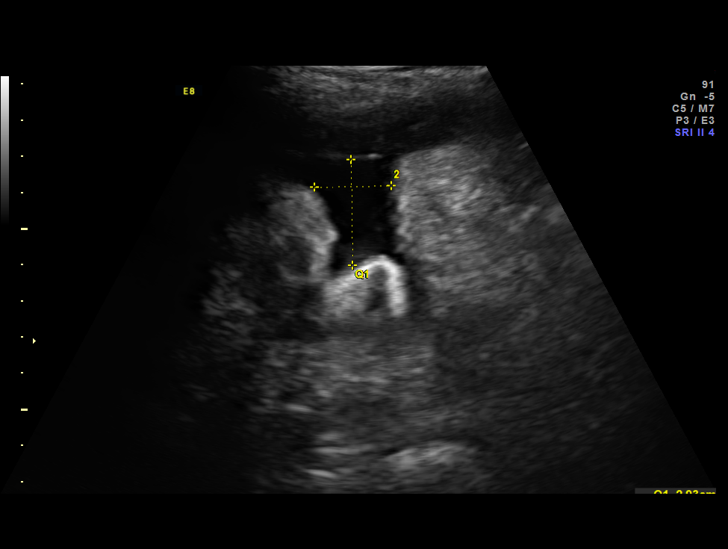
[im 30/37]
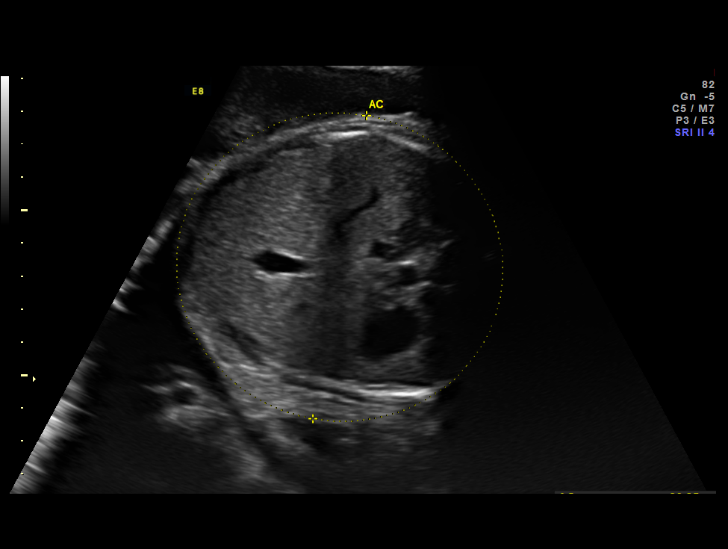
[im 33/37]
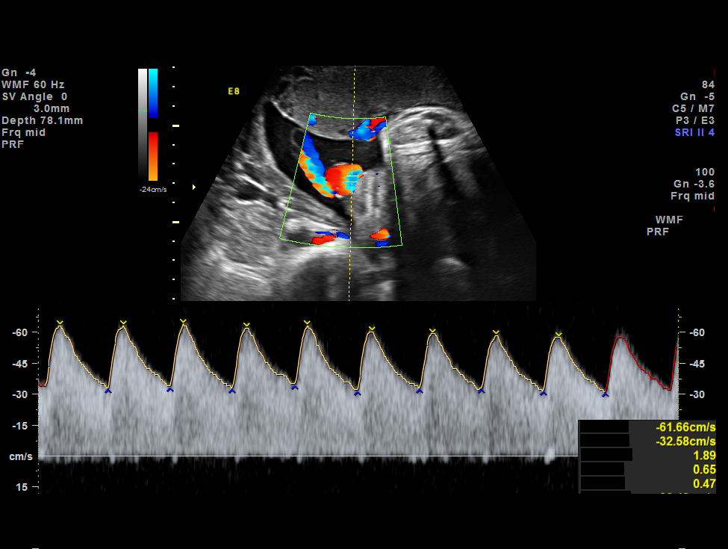
[im 35/37]
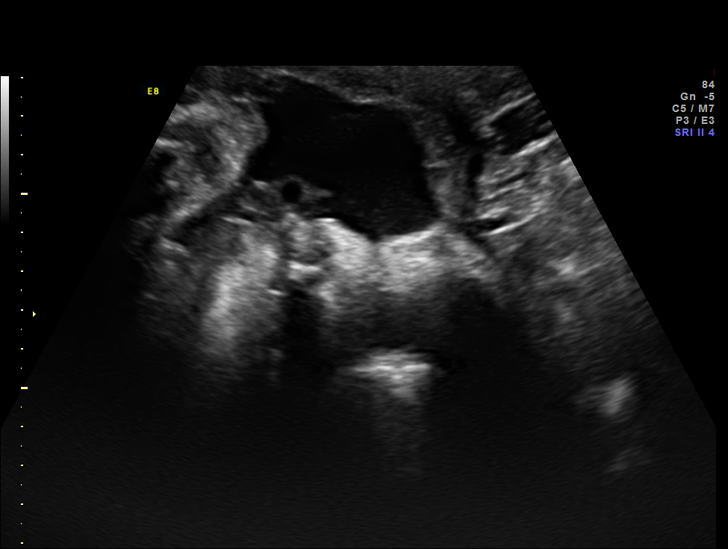

[12 of 28 positions shown; findings below may reference images not displayed]

OBSTETRICS REPORT
                      (Signed Final 04/12/2013 [DATE])

 Name:       VIKANI BALANI                   Visit Date: 04/12/2013 [DATE]

Service(s) Provided

 US OB FOLLOW UP                                       76816.1
 US UA CORD DOPPLER                                    76820.0
Indications

 Bilateral Choroid plexus cysts - negative QUAD
 screen
 Hepatitis B
 Poor obstetric history: Previous fetal growth
 restriction (FGR)
Fetal Evaluation

 Num Of Fetuses:    1
 Fetal Heart Rate:  177                          bpm
 Cardiac Activity:  Observed
 Presentation:      Cephalic
 Placenta:          Anterior, above cervical os
 P. Cord            Previously Visualized
 Insertion:

 Amniotic Fluid
 AFI FV:      Subjectively within normal limits
 AFI Sum:     11.6    cm       36  %Tile     Larg Pckt:    3.16  cm
 RUQ:   2.93    cm   RLQ:    2.37   cm    LUQ:   3.16    cm   LLQ:    3.14   cm
Biometry

 BPD:     83.9  mm     G. Age:  33w 5d                CI:         77.4   70 - 86
 OFD:    108.4  mm                                    FL/HC:      23.6   20.8 -

 HC:     306.2  mm     G. Age:  34w 1d      < 3  %    HC/AC:      1.01   0.92 -

 AC:       303  mm     G. Age:  34w 2d      < 3  %    FL/BPD:     86.1   71 - 87
 FL:      72.2  mm     G. Age:  37w 0d       42  %    FL/AC:      23.8   20 - 24
 HUM:     62.7  mm     G. Age:  36w 3d       55  %

 Est. FW:    9663  gm    5 lb 10 oz      20  %
Gestational Age

 LMP:           37w 2d        Date:  07/25/12                 EDD:   05/01/13
 U/S Today:     34w 5d                                        EDD:   05/19/13
 Best:          37w 2d     Det. By:  LMP  (07/25/12)          EDD:   05/01/13
Anatomy

 Cranium:          Previously seen        Aortic Arch:      Previously seen
 Fetal Cavum:      Previously seen        Ductal Arch:      Previously seen
 Ventricles:       Previously seen        Diaphragm:        Previously seen
 Choroid Plexus:   Resolved CPC           Stomach:          Appears normal, left
                                                            sided
 Cerebellum:       Previously seen        Abdomen:          Previously seen
 Posterior Fossa:  Previously seen        Abdominal Wall:   Previously seen
 Nuchal Fold:      Not applicable (>20    Cord Vessels:     Previously seen
                   wks GA)
 Face:             Orbits and profile     Kidneys:          Appear normal
                   previously seen
 Lips:             Previously seen        Bladder:          Appears normal
 Heart:            Appears normal         Spine:            Previously seen
                   (4CH, axis, and
                   situs)
 RVOT:             Previously seen        Lower             Previously seen
                                          Extremities:
 LVOT:             Previously seen        Upper             Previously seen
                                          Extremities:

 Other:  Female gender previously seen. Heels and 5th digit previously seen.
         Nasal bone previously visualized.
Doppler - Fetal Vessels

 Umbilical Artery
 S/D:   2.12           37  %tile       RI:
 PI:    0.74                           PSV:       61.66   cm/s

Cervix Uterus Adnexa

 Cervix:       Not visualized (advanced GA >81wks)
Impression

 IUP at 37+2 weeks
 Normal interval anatomy; anatomic survey complete
 Normal amniotic fluid volume
 EFW at the 20th %tile; AC < 3rd %tile
 UA dopplers were normal for this GA
Recommendations

 Weekly BPPs (transportation difficulties) - same day as clinic
 visits
 Deliver by [facility]

 questions or concerns.

## 2014-09-01 IMAGING — US US FETAL BPP W/O NONSTRESS
1 series · 13 of 13 positions shown · non-contrast
Comparison: none

[Series 1: us fetal bpp w/o nonstress · non-contrast · 13 acquisitions, 13 frames shown]
[im 1/13]
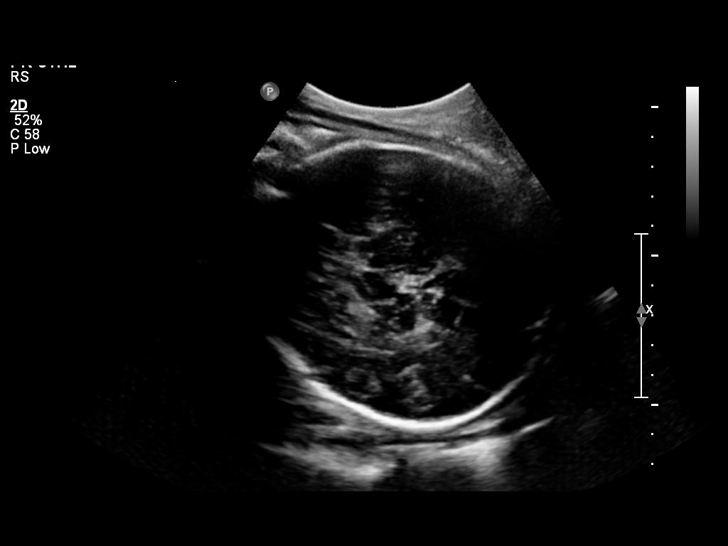
[im 2/13]
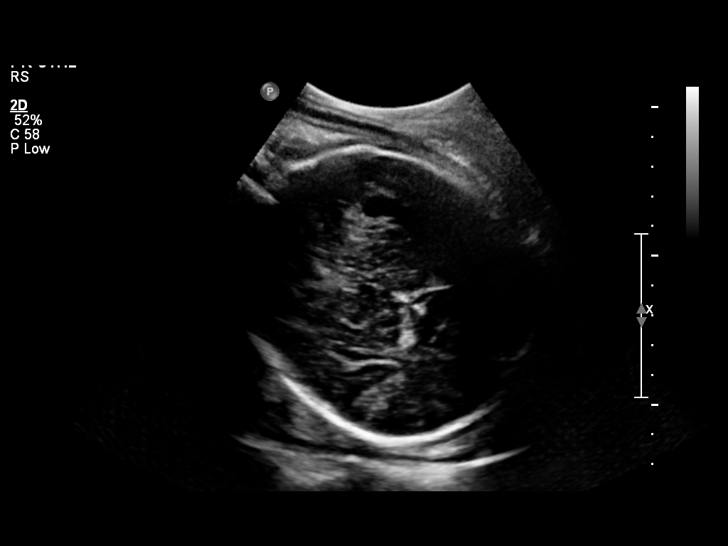
[im 3/13]
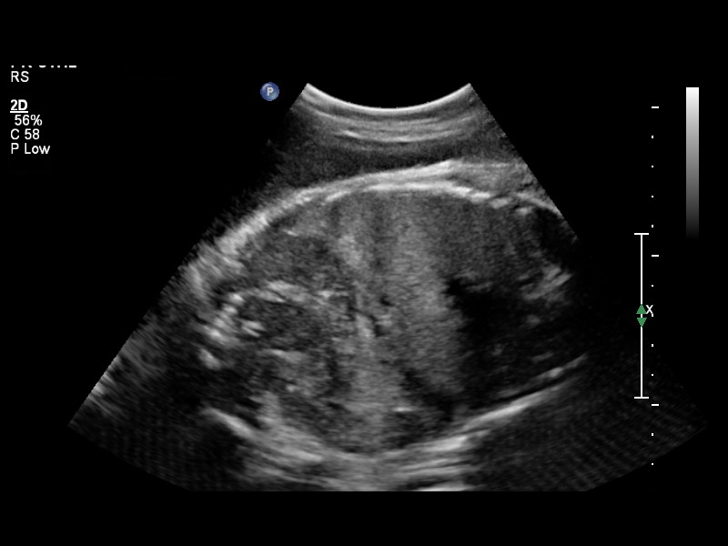
[im 4/13]
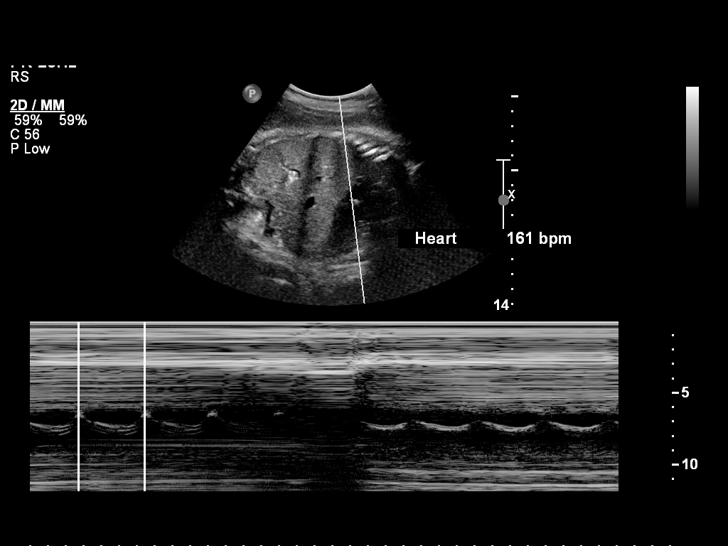
[im 5/13]
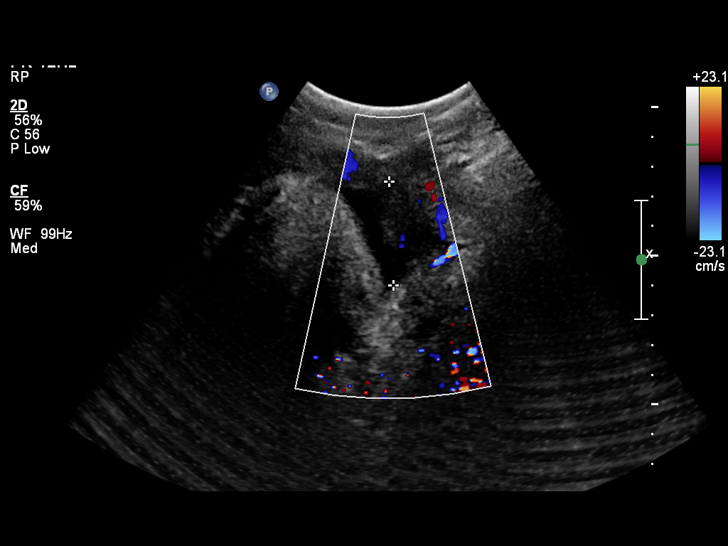
[im 6/13]
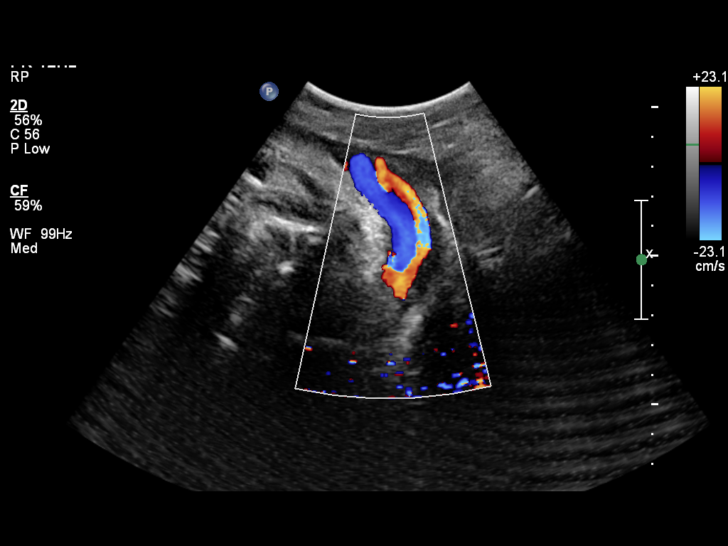
[im 7/13]
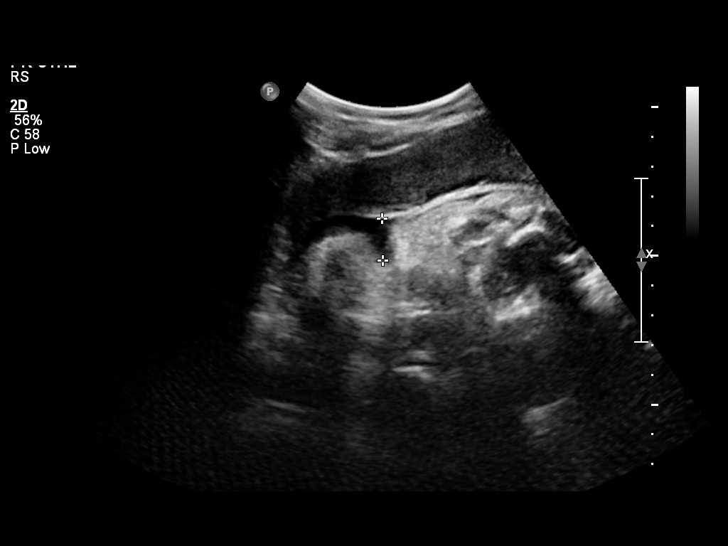
[im 8/13]
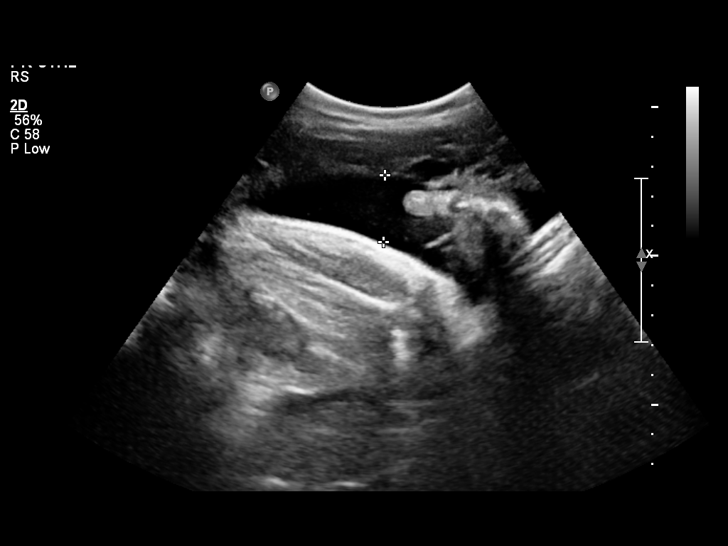
[im 9/13]
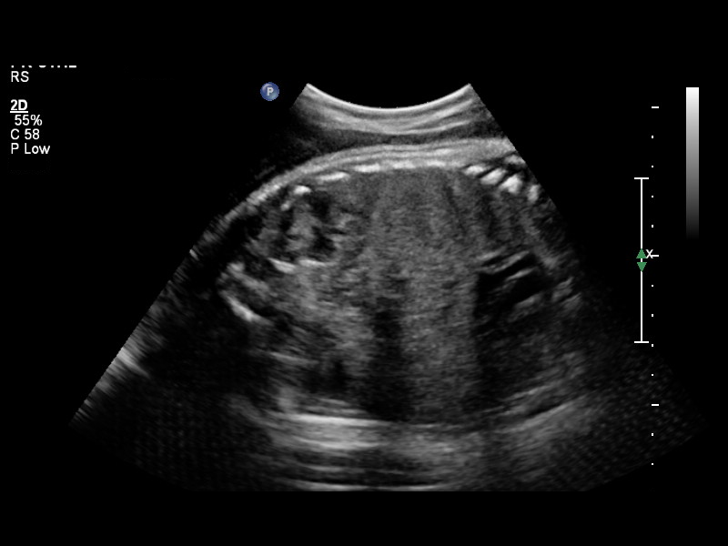
[im 10/13]
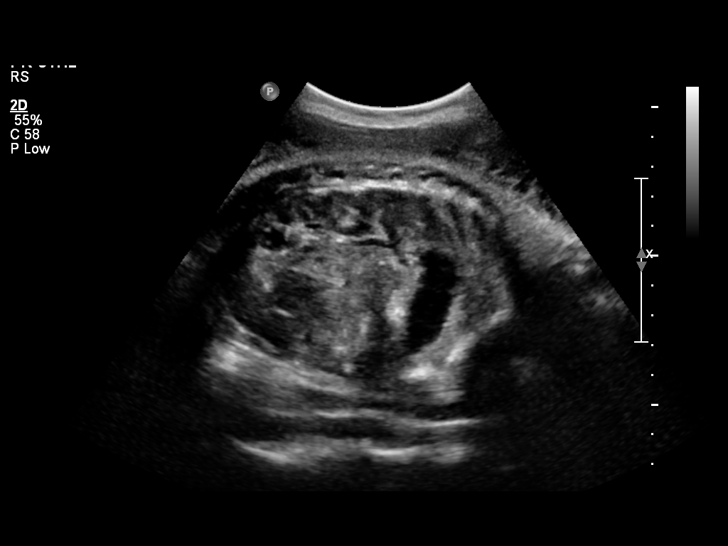
[im 11/13]
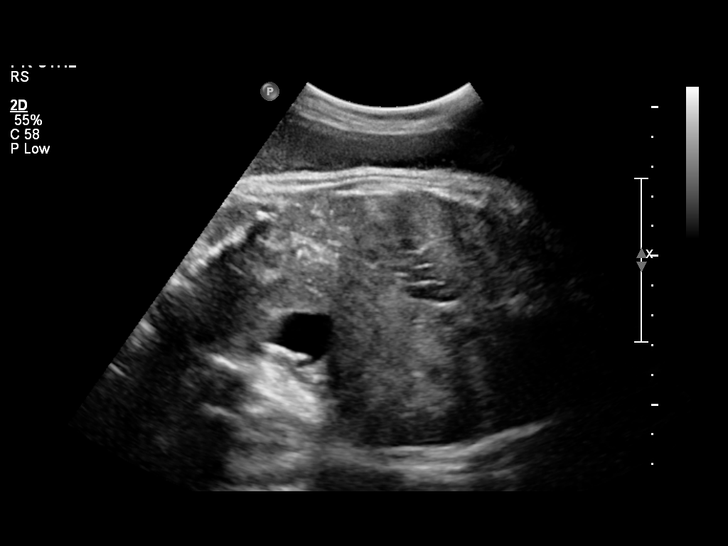
[im 12/13]
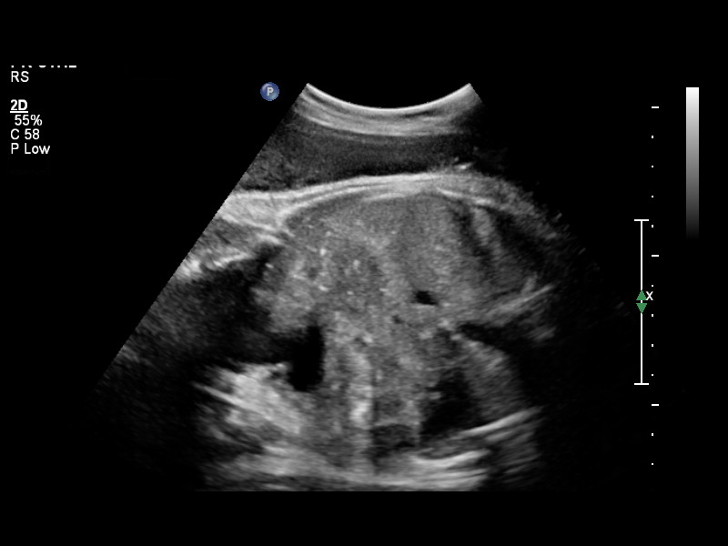
[im 13/13]
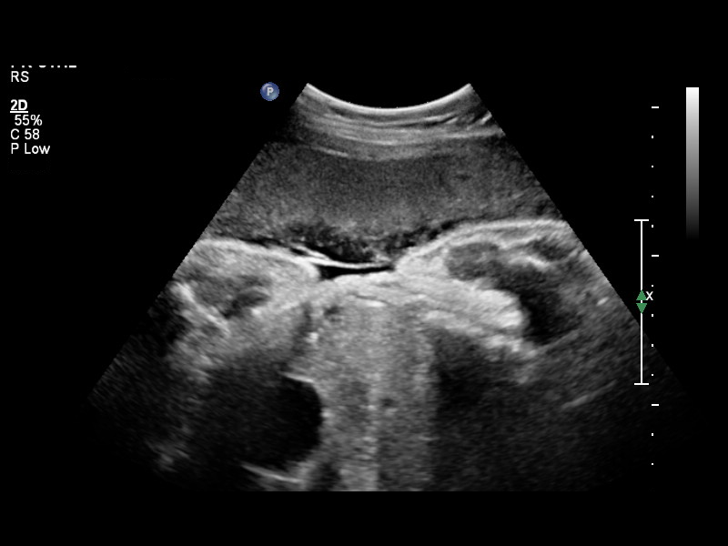

[13 of 13 positions shown; findings below may reference images not displayed]

OBSTETRICS REPORT
                      (Signed Final 04/26/2013 [DATE])

Service(s) Provided

 [HOSPITAL]                                         76815.0
Indications

 Bilateral Choroid plexus cysts - negative QUAD
 screen
 Hepatitis B
 Poor obstetric history: Previous fetal growth
 restriction (FGR)
Fetal Evaluation

 Num Of Fetuses:    1
 Fetal Heart Rate:  161                          bpm
 Cardiac Activity:  Observed
 Presentation:      Cephalic
 Placenta:          Anterior, above cervical os

 Amniotic Fluid
 AFI FV:      Subjectively low-normal
 AFI Sum:     7.12    cm        5  %Tile     Larg Pckt:    3.48  cm
 RUQ:   2.24    cm   RLQ:    3.48   cm    LUQ:   1.4     cm
Biophysical Evaluation

 Amniotic F.V:   Within normal limits       F. Tone:        Observed
 F. Movement:    Observed                   Score:          [DATE]
 F. Breathing:   Observed
Gestational Age

 LMP:           39w 2d        Date:  07/25/12                 EDD:   05/01/13
 Best:          39w 2d     Det. By:  LMP  (07/25/12)          EDD:   05/01/13
Anatomy

 Stomach:          Appears normal, left   Bladder:          Appears normal
                   sided
Impression

 Single IUP at 39 [DATE] weeks
 Active fetus - BPP [DATE]
 Subjectively low amniotic fluid (AFI 7.1 cm)
Recommendations

 Follow-up ultrasounds as clinically indicated.

 questions or concerns.

## 2014-09-19 ENCOUNTER — Ambulatory Visit (INDEPENDENT_AMBULATORY_CARE_PROVIDER_SITE_OTHER): Payer: Self-pay | Admitting: Family Medicine

## 2014-09-19 ENCOUNTER — Encounter: Payer: Self-pay | Admitting: Family Medicine

## 2014-09-19 VITALS — BP 107/67 | HR 56 | Temp 98.2°F | Wt 104.8 lb

## 2014-09-19 DIAGNOSIS — Z308 Encounter for other contraceptive management: Secondary | ICD-10-CM

## 2014-09-19 DIAGNOSIS — Z3046 Encounter for surveillance of implantable subdermal contraceptive: Secondary | ICD-10-CM

## 2014-09-19 NOTE — Progress Notes (Signed)
Patient wishes her nexplanon to be removed due to cramping and irregular menses.  Offered other forms of birth control, which was declined by the patient.  Removal  Patient given informed consent for removal of her Nexplanon, time out was performed.  Signed copy in the chart.  Appropriate time out taken. Implanon site identified.  Area prepped in usual sterile fashon. 2mL of 1% lidocaine was used to anesthetize the area at the distal end of the implant. A small stab incision was made right beside the implant on the distal portion.  The implanon rod was grasped using hemostats and removed without difficulty.  There was less than 3 mL blood loss. There were no complications.  A small amount of antibiotic ointment and steri-strips were applied over the small incision.  A pressure bandage was applied to reduce any bruising.  The patient tolerated the procedure well and was given post procedure instructions.

## 2014-09-19 NOTE — Progress Notes (Signed)
Patient ID: Kelly Luna, female   DOB: 02/06/1986, 29 y.o.   MRN: [id number] Pt desires Nexplanon removal due to irregular bleeding, Interpreter present with patient through encounter

## 2014-10-07 ENCOUNTER — Encounter: Payer: Self-pay | Admitting: *Deleted
